# Patient Record
Sex: Female | Born: 1935 | Race: White | Hispanic: No | Smoking: Never smoker
Health system: Southern US, Community
[De-identification: ages and names within clinical notes are randomized; demographics above are authoritative.]

## PROBLEM LIST (undated history)

## (undated) DIAGNOSIS — J986 Disorders of diaphragm: Secondary | ICD-10-CM

## (undated) DIAGNOSIS — K626 Ulcer of anus and rectum: Secondary | ICD-10-CM

## (undated) DIAGNOSIS — K120 Recurrent oral aphthae: Secondary | ICD-10-CM

## (undated) DIAGNOSIS — E875 Hyperkalemia: Secondary | ICD-10-CM

## (undated) DIAGNOSIS — S0502XA Injury of conjunctiva and corneal abrasion without foreign body, left eye, initial encounter: Secondary | ICD-10-CM

## (undated) DIAGNOSIS — G629 Polyneuropathy, unspecified: Secondary | ICD-10-CM

## (undated) DIAGNOSIS — K219 Gastro-esophageal reflux disease without esophagitis: Secondary | ICD-10-CM

## (undated) DIAGNOSIS — R32 Unspecified urinary incontinence: Secondary | ICD-10-CM

## (undated) DIAGNOSIS — S72309A Unspecified fracture of shaft of unspecified femur, initial encounter for closed fracture: Secondary | ICD-10-CM

## (undated) DIAGNOSIS — I6529 Occlusion and stenosis of unspecified carotid artery: Secondary | ICD-10-CM

## (undated) DIAGNOSIS — F419 Anxiety disorder, unspecified: Secondary | ICD-10-CM

## (undated) DIAGNOSIS — M1990 Unspecified osteoarthritis, unspecified site: Secondary | ICD-10-CM

## (undated) DIAGNOSIS — J449 Chronic obstructive pulmonary disease, unspecified: Secondary | ICD-10-CM

## (undated) HISTORY — PX: FOOT SURGERY: SHX648

## (undated) HISTORY — DX: Polyneuropathy, unspecified: G62.9

## (undated) HISTORY — DX: Disorders of diaphragm: J98.6

## (undated) HISTORY — PX: CATARACT EXTRACTION: SUR2

## (undated) HISTORY — DX: Gastro-esophageal reflux disease without esophagitis: K21.9

## (undated) HISTORY — PX: HIP SURGERY: SHX245

## (undated) HISTORY — DX: Occlusion and stenosis of unspecified carotid artery: I65.29

## (undated) HISTORY — DX: Unspecified fracture of shaft of unspecified femur, initial encounter for closed fracture: S72.309A

## (undated) HISTORY — DX: Ulcer of anus and rectum: K62.6

## (undated) HISTORY — DX: Anxiety disorder, unspecified: F41.9

## (undated) HISTORY — PX: OTHER SURGICAL HISTORY: SHX169

## (undated) HISTORY — DX: Unspecified osteoarthritis, unspecified site: M19.90

## (undated) HISTORY — DX: Hyperkalemia: E87.5

## (undated) HISTORY — DX: Injury of conjunctiva and corneal abrasion without foreign body, left eye, initial encounter: S05.02XA

## (undated) HISTORY — DX: Unspecified urinary incontinence: R32

## (undated) HISTORY — DX: Recurrent oral aphthae: K12.0

---

## 2010-08-05 ENCOUNTER — Inpatient Hospital Stay (HOSPITAL_COMMUNITY)
Admission: EM | Admit: 2010-08-05 | Discharge: 2010-08-11 | Payer: Self-pay | Source: Home / Self Care | Attending: Orthopaedic Surgery | Admitting: Orthopaedic Surgery

## 2010-08-05 LAB — COMPREHENSIVE METABOLIC PANEL
ALT: 29 U/L (ref 0–35)
AST: 31 U/L (ref 0–37)
Albumin: 3.7 g/dL (ref 3.5–5.2)
Alkaline Phosphatase: 57 U/L (ref 39–117)
BUN: 16 mg/dL (ref 6–23)
CO2: 30 mEq/L (ref 19–32)
Calcium: 9 mg/dL (ref 8.4–10.5)
Chloride: 95 mEq/L — ABNORMAL LOW (ref 96–112)
Creatinine, Ser: 0.85 mg/dL (ref 0.4–1.2)
GFR calc Af Amer: >60 mL/min (ref >60)
GFR calc non Af Amer: >60 mL/min (ref >60)
Glucose, Bld: 97 mg/dL (ref 70–99)
Potassium: 5.1 mEq/L (ref 3.5–5.1)
Sodium: 133 mEq/L — ABNORMAL LOW (ref 135–145)
Total Bilirubin: 0.4 mg/dL (ref 0.3–1.2)
Total Protein: 6.1 g/dL (ref 6.0–8.3)

## 2010-08-05 LAB — PROTIME-INR
INR: 0.96 (ref 0.00–1.49)
Prothrombin Time: 13 seconds (ref 11.6–15.2)

## 2010-08-05 LAB — CBC
HCT: 36.1 % (ref 36.0–46.0)
HCT: 29.9 % — ABNORMAL LOW (ref 36.0–46.0)
Hemoglobin: 12.3 g/dL (ref 12.0–15.0)
Hemoglobin: 10.2 g/dL — ABNORMAL LOW (ref 12.0–15.0)
MCH: 29.9 pg (ref 26.0–34.0)
MCH: 29.8 pg (ref 26.0–34.0)
MCHC: 34.1 g/dL (ref 30.0–36.0)
MCHC: 34.1 g/dL (ref 30.0–36.0)
MCV: 87.6 fL (ref 78.0–100.0)
MCV: 87.4 fL (ref 78.0–100.0)
Platelets: 258 10*3/uL (ref 150–400)
Platelets: 231 10*3/uL (ref 150–400)
RBC: 4.12 MIL/uL (ref 3.87–5.11)
RBC: 3.42 MIL/uL — ABNORMAL LOW (ref 3.87–5.11)
RDW: 13.4 % (ref 11.5–15.5)
RDW: 13.4 % (ref 11.5–15.5)
WBC: 6.3 10*3/uL (ref 4.0–10.5)
WBC: 9.5 10*3/uL (ref 4.0–10.5)

## 2010-08-05 LAB — DIFFERENTIAL
Basophils Absolute: 0 10*3/uL (ref 0.0–0.1)
Basophils Relative: 0 % (ref 0–1)
Eosinophils Absolute: 0.1 10*3/uL (ref 0.0–0.7)
Eosinophils Relative: 1 % (ref 0–5)
Lymphocytes Relative: 11 % — ABNORMAL LOW (ref 12–46)
Lymphs Abs: 0.7 10*3/uL (ref 0.7–4.0)
Monocytes Absolute: 0.4 10*3/uL (ref 0.1–1.0)
Monocytes Relative: 6 % (ref 3–12)
Neutro Abs: 5.1 10*3/uL (ref 1.7–7.7)
Neutrophils Relative %: 82 % — ABNORMAL HIGH (ref 43–77)

## 2010-08-05 LAB — CREATININE, SERUM
Creatinine, Ser: 0.85 mg/dL (ref 0.4–1.2)
GFR calc Af Amer: >60 mL/min (ref >60)
GFR calc non Af Amer: >60 mL/min (ref >60)

## 2010-08-05 LAB — GLUCOSE, CAPILLARY: Glucose-Capillary: 97 mg/dL (ref 70–99)

## 2010-08-05 LAB — ABO/RH: ABO/RH(D): B POS

## 2010-08-06 LAB — BASIC METABOLIC PANEL
BUN: 11 mg/dL (ref 6–23)
CO2: 30 mEq/L (ref 19–32)
Calcium: 7.5 mg/dL — ABNORMAL LOW (ref 8.4–10.5)
Chloride: 101 mEq/L (ref 96–112)
Creatinine, Ser: 0.86 mg/dL (ref 0.4–1.2)
GFR calc Af Amer: >60 mL/min (ref >60)
GFR calc non Af Amer: >60 mL/min (ref >60)
Glucose, Bld: 105 mg/dL — ABNORMAL HIGH (ref 70–99)
Potassium: 4.6 mEq/L (ref 3.5–5.1)
Sodium: 134 mEq/L — ABNORMAL LOW (ref 135–145)

## 2010-08-06 LAB — CBC
HCT: 25.6 % — ABNORMAL LOW (ref 36.0–46.0)
Hemoglobin: 8.5 g/dL — ABNORMAL LOW (ref 12.0–15.0)
MCH: 29.6 pg (ref 26.0–34.0)
MCHC: 33.2 g/dL (ref 30.0–36.0)
MCV: 89.2 fL (ref 78.0–100.0)
Platelets: 204 10*3/uL (ref 150–400)
RBC: 2.87 MIL/uL — ABNORMAL LOW (ref 3.87–5.11)
RDW: 13.7 % (ref 11.5–15.5)
WBC: 6.6 10*3/uL (ref 4.0–10.5)

## 2010-08-06 LAB — PREPARE RBC (CROSSMATCH)

## 2010-08-07 LAB — TYPE AND SCREEN
ABO/RH(D): B POS
Antibody Screen: NEGATIVE
Unit division: 0
Unit division: 0

## 2010-08-07 LAB — CBC
HCT: 29.8 % — ABNORMAL LOW (ref 36.0–46.0)
Hemoglobin: 10.1 g/dL — ABNORMAL LOW (ref 12.0–15.0)
MCH: 29.1 pg (ref 26.0–34.0)
MCHC: 33.9 g/dL (ref 30.0–36.0)
MCV: 85.9 fL (ref 78.0–100.0)
Platelets: 161 10*3/uL (ref 150–400)
RBC: 3.47 MIL/uL — ABNORMAL LOW (ref 3.87–5.11)
RDW: 15.6 % — ABNORMAL HIGH (ref 11.5–15.5)
WBC: 5.9 10*3/uL (ref 4.0–10.5)

## 2010-08-07 LAB — BASIC METABOLIC PANEL
BUN: 8 mg/dL (ref 6–23)
CO2: 26 mEq/L (ref 19–32)
Calcium: 7.5 mg/dL — ABNORMAL LOW (ref 8.4–10.5)
Chloride: 101 mEq/L (ref 96–112)
Creatinine, Ser: 0.76 mg/dL (ref 0.4–1.2)
GFR calc Af Amer: >60 mL/min (ref >60)
GFR calc non Af Amer: >60 mL/min (ref >60)
Glucose, Bld: 106 mg/dL — ABNORMAL HIGH (ref 70–99)
Potassium: 4 mEq/L (ref 3.5–5.1)
Sodium: 132 mEq/L — ABNORMAL LOW (ref 135–145)

## 2010-08-17 LAB — BASIC METABOLIC PANEL
BUN: 6 mg/dL (ref 6–23)
BUN: 8 mg/dL (ref 6–23)
CO2: 28 mEq/L (ref 19–32)
CO2: 28 mEq/L (ref 19–32)
Calcium: 7.6 mg/dL — ABNORMAL LOW (ref 8.4–10.5)
Calcium: 7.7 mg/dL — ABNORMAL LOW (ref 8.4–10.5)
Chloride: 99 mEq/L (ref 96–112)
Chloride: 105 mEq/L (ref 96–112)
Creatinine, Ser: 0.66 mg/dL (ref 0.4–1.2)
Creatinine, Ser: 0.69 mg/dL (ref 0.4–1.2)
GFR calc Af Amer: >60 mL/min (ref >60)
GFR calc Af Amer: >60 mL/min (ref >60)
GFR calc non Af Amer: >60 mL/min (ref >60)
GFR calc non Af Amer: >60 mL/min (ref >60)
Glucose, Bld: 149 mg/dL — ABNORMAL HIGH (ref 70–99)
Glucose, Bld: 104 mg/dL — ABNORMAL HIGH (ref 70–99)
Potassium: 3.9 mEq/L (ref 3.5–5.1)
Potassium: 4.1 mEq/L (ref 3.5–5.1)
Sodium: 131 mEq/L — ABNORMAL LOW (ref 135–145)
Sodium: 137 mEq/L (ref 135–145)

## 2010-08-17 LAB — CBC
HCT: 27.1 % — ABNORMAL LOW (ref 36.0–46.0)
Hemoglobin: 9.4 g/dL — ABNORMAL LOW (ref 12.0–15.0)
MCH: 29.9 pg (ref 26.0–34.0)
MCHC: 34.7 g/dL (ref 30.0–36.0)
MCV: 86.3 fL (ref 78.0–100.0)
Platelets: 160 10*3/uL (ref 150–400)
RBC: 3.14 MIL/uL — ABNORMAL LOW (ref 3.87–5.11)
RDW: 15.1 % (ref 11.5–15.5)
WBC: 5.4 10*3/uL (ref 4.0–10.5)

## 2011-10-06 ENCOUNTER — Other Ambulatory Visit: Payer: Self-pay | Admitting: Cardiothoracic Surgery

## 2011-10-06 ENCOUNTER — Encounter: Payer: Self-pay | Admitting: Cardiothoracic Surgery

## 2011-10-06 ENCOUNTER — Institutional Professional Consult (permissible substitution) (INDEPENDENT_AMBULATORY_CARE_PROVIDER_SITE_OTHER): Payer: Medicare Other | Admitting: Cardiothoracic Surgery

## 2011-10-06 VITALS — BP 139/65 | HR 93 | Resp 16 | Ht <= 58 in | Wt 110.0 lb

## 2011-10-06 DIAGNOSIS — E875 Hyperkalemia: Secondary | ICD-10-CM

## 2011-10-06 DIAGNOSIS — G629 Polyneuropathy, unspecified: Secondary | ICD-10-CM

## 2011-10-06 DIAGNOSIS — I6529 Occlusion and stenosis of unspecified carotid artery: Secondary | ICD-10-CM

## 2011-10-06 DIAGNOSIS — K449 Diaphragmatic hernia without obstruction or gangrene: Secondary | ICD-10-CM

## 2011-10-06 DIAGNOSIS — M810 Age-related osteoporosis without current pathological fracture: Secondary | ICD-10-CM | POA: Insufficient documentation

## 2011-10-06 DIAGNOSIS — Q791 Other congenital malformations of diaphragm: Secondary | ICD-10-CM

## 2011-10-06 DIAGNOSIS — K219 Gastro-esophageal reflux disease without esophagitis: Secondary | ICD-10-CM

## 2011-10-06 DIAGNOSIS — S0502XA Injury of conjunctiva and corneal abrasion without foreign body, left eye, initial encounter: Secondary | ICD-10-CM

## 2011-10-06 DIAGNOSIS — R69 Illness, unspecified: Secondary | ICD-10-CM

## 2011-10-06 DIAGNOSIS — R32 Unspecified urinary incontinence: Secondary | ICD-10-CM

## 2011-10-06 DIAGNOSIS — R0602 Shortness of breath: Secondary | ICD-10-CM

## 2011-10-06 DIAGNOSIS — K120 Recurrent oral aphthae: Secondary | ICD-10-CM

## 2011-10-06 DIAGNOSIS — F419 Anxiety disorder, unspecified: Secondary | ICD-10-CM

## 2011-10-06 DIAGNOSIS — J986 Disorders of diaphragm: Secondary | ICD-10-CM

## 2011-10-06 NOTE — Progress Notes (Signed)
PCP is Fredderick Severance, MD, MD Referring Provider is Fredderick Severance, MD  Chief Complaint  Patient presents with  . Shortness of Breath    cxr shows elevated left hemidiaphragm....Marland Kitcheneval and treat    HPI: The patient is a 78 year old Caucasian female referred by her primary care physician Dr. Edmon Crape in [facility] for evaluation of a eventration or herniation of left hemidiaphragm. The patient's symptoms mainly involved shortness of breath and a choking feeling when she bends over 200 shoes on especially after eating a meal. Review of her old x-ray shows only an x-ray from January 2012 when she had a femur fracture and a that time her preop chest x-ray showed elevation of the left hemidiaphragm to halfway up the thorax with bile gas pattern consistent with colon underneath the diaphragm. The patient denies any choking episodes or significant pain with eating. She denies any history of regurgitation of undigested food. She denies any history of significant thoracic trauma that could have a result in paralysis of the left phrenic nerve. She has had surgery only for a C-sections which were performed through a laparotomy midline incision decades ago. She is never smoked but she does have some wheezing which bends over. No history of cardiac disease or arrhythmia.   Past Medical History  Diagnosis Date  . Elevated hemidiaphragm     Left  . Anxiety   . Abrasion of cornea, left   . Aphthous ulcer   . Carotid artery stenosis   . GERD (gastroesophageal reflux disease)   . Hyperkalemia   . Osteoarthritis   . Osteoporosis   . Peripheral neuropathy   . Incontinence of urine     urge  . Anal ulcer   . Fracture of femur, shaft     closed, Right    Past Surgical History  Procedure Date  . Cataract extraction   . Cesarean section   . Foot surgery   . Hip surgery     right, Dr. Magnus Ivan  . Neuroplasty decompression median nerve at carpal tunnel     Family History  Problem Relation Age of Onset  .  Alzheimer's disease    . Diabetes type II    . Hypertension    . Mental retardation    . Osteoporosis    . Parkinsonism    . Cancer    . Heart disease      Social History History  Substance Use Topics  . Smoking status: Never Smoker   . Smokeless tobacco: Never Used  . Alcohol Use: No    Current Outpatient Prescriptions  Medication Sig Dispense Refill  . aspirin 81 MG tablet Take 81 mg by mouth daily.      . diclofenac sodium (VOLTAREN) 1 % GEL Apply 1 application topically 4 (four) times daily.      . famotidine (PEPCID) 40 MG tablet Take 40 mg by mouth daily.      Marland Kitchen gabapentin (NEURONTIN) 300 MG capsule Take 300 mg by mouth 3 (three) times daily. 1 tab po every      . oxybutynin (DITROPAN) 5 MG tablet Take 5 mg by mouth 1 day or 1 dose.      Bertram Gala Glycol-Propyl Glycol (SYSTANE ULTRA) 0.4-0.3 % SOLN Apply to eye 3 (three) times daily. One drop in both eyes      . albuterol (PROVENTIL HFA;VENTOLIN HFA) 108 (90 BASE) MCG/ACT inhaler Inhale 2 puffs into the lungs every 6 (six) hours as needed.      . clindamycin (  CLINDAGEL) 1 % gel Apply topically 3 (three) times daily.        No Known Allergies  Review of Systems constitutional review is negative her fever or weight loss HEENT review is negative for difficulty swallowing or dental complaints Cardiac review is negative for arrhythmia angina or history of murmur GI review is negative her history of gallstones ulcer disease or blood per M. neurologic views negative for kidney stones musculoskeletal review is positive for right femur fracture treated at [place] by Dr. Magnus Ivan with a steel pin patient uses a rolling walker to get around the home and she lives independently but is dependent significant her daughter. She is not currently Dr. She denies diabetes. She states she had no significant pulmonary problems when she had her general anesthesia for the right femur pinned one year ago.  BP 139/65  Pulse 93  Resp 16  Ht  4\' 10"  (1.473 m)  Wt 110 lb (49.896 kg)  BMI 22.99 kg/m2  SpO2 98% Physical Exam Gen.-Pleasant elderly Caucasian female no acute distress HEENT-normocephalic pupils equal dentition good Thorax-no tenderness deformity breath sounds are diminished at the left base Cardiac-regular rhythm without murmur or gallop Abdomen fairly flaccid abdominal wall midline surgical scars no pulsatile mass or organomegaly normal bowel sounds -Extremities positive pulses no clubbing or edema or tenderness Neurologic she walks slowly with her walker no focal motor deficit she is right-hand dominant     Diagnostic Tests: Reviewed her chest x-ray from a year ago and her CT scan she brought to the office from [facility] is a CT scan of the chest without contrast is difficult to evaluate whether this is an eventration or a paraesophageal hernia or a lateral a congenital hernia.   Impression: Repeat CT scan of the chest and abdomen with contrast, 2-D echo to evaluate cardiac reserve surgery is a possibility. Her spirometry and office indicates FVC 1.1 FEV1 of 0.86 both of which are 55% of predicted.  Plan: Return after above studies to discuss situation possible surgery once the diagnosis of eventration versus diaphragmatic hernias clarified.

## 2011-10-06 NOTE — Patient Instructions (Signed)
Do not eat a meal and then bend over or leg down The head of bed elevated at night

## 2011-10-07 LAB — CREATININE, SERUM: Creat: 0.87 mg/dL (ref 0.50–1.10)

## 2011-10-07 LAB — BUN: BUN: 28 mg/dL — ABNORMAL HIGH (ref 6–23)

## 2011-10-13 ENCOUNTER — Ambulatory Visit (HOSPITAL_COMMUNITY): Payer: Medicare Other

## 2011-10-14 ENCOUNTER — Ambulatory Visit
Admission: RE | Admit: 2011-10-14 | Discharge: 2011-10-14 | Disposition: A | Discharge status: Home / Self Care | Payer: Medicare Other | Source: Ambulatory Visit | Attending: Cardiothoracic Surgery | Admitting: Cardiothoracic Surgery

## 2011-10-14 DIAGNOSIS — J986 Disorders of diaphragm: Secondary | ICD-10-CM

## 2011-10-14 DIAGNOSIS — K449 Diaphragmatic hernia without obstruction or gangrene: Secondary | ICD-10-CM

## 2011-10-14 MED ORDER — IOHEXOL 300 MG/ML  SOLN
100.0000 mL | Freq: Once | INTRAMUSCULAR | Status: AC | PRN
  Administered 2011-10-14: 100 mL via INTRAVENOUS

## 2011-10-15 ENCOUNTER — Ambulatory Visit (HOSPITAL_COMMUNITY)
Admission: RE | Admit: 2011-10-15 | Discharge: 2011-10-15 | Disposition: A | Discharge status: Home / Self Care | Payer: Medicare Other | Source: Ambulatory Visit | Attending: Cardiothoracic Surgery | Admitting: Cardiothoracic Surgery

## 2011-10-15 DIAGNOSIS — R0602 Shortness of breath: Secondary | ICD-10-CM | POA: Insufficient documentation

## 2011-10-15 DIAGNOSIS — R69 Illness, unspecified: Secondary | ICD-10-CM

## 2011-10-15 DIAGNOSIS — I079 Rheumatic tricuspid valve disease, unspecified: Secondary | ICD-10-CM | POA: Insufficient documentation

## 2011-10-15 DIAGNOSIS — I059 Rheumatic mitral valve disease, unspecified: Secondary | ICD-10-CM | POA: Insufficient documentation

## 2011-10-15 NOTE — Progress Notes (Signed)
*  PRELIMINARY RESULTS* Echocardiogram 2D Echocardiogram has been performed.  Beverly Adkins 10/15/2011, 2:56 PM

## 2011-10-18 ENCOUNTER — Encounter (HOSPITAL_COMMUNITY): Payer: Self-pay | Admitting: *Deleted

## 2011-10-18 ENCOUNTER — Telehealth: Payer: Self-pay | Admitting: *Deleted

## 2011-10-18 ENCOUNTER — Other Ambulatory Visit: Payer: Self-pay

## 2011-10-18 ENCOUNTER — Emergency Department (HOSPITAL_COMMUNITY): Payer: Medicare Other

## 2011-10-18 ENCOUNTER — Inpatient Hospital Stay (HOSPITAL_COMMUNITY)
Admission: EM | Admit: 2011-10-18 | Discharge: 2011-10-27 | DRG: 391 | Disposition: A | Discharge status: Skilled Nursing Facility | Payer: Medicare Other | Attending: Internal Medicine | Admitting: Internal Medicine

## 2011-10-18 DIAGNOSIS — R4182 Altered mental status, unspecified: Secondary | ICD-10-CM

## 2011-10-18 DIAGNOSIS — E875 Hyperkalemia: Secondary | ICD-10-CM

## 2011-10-18 DIAGNOSIS — K120 Recurrent oral aphthae: Secondary | ICD-10-CM

## 2011-10-18 DIAGNOSIS — K5900 Constipation, unspecified: Principal | ICD-10-CM

## 2011-10-18 DIAGNOSIS — R509 Fever, unspecified: Secondary | ICD-10-CM | POA: Diagnosis not present

## 2011-10-18 DIAGNOSIS — S0502XA Injury of conjunctiva and corneal abrasion without foreign body, left eye, initial encounter: Secondary | ICD-10-CM

## 2011-10-18 DIAGNOSIS — M8448XA Pathological fracture, other site, initial encounter for fracture: Secondary | ICD-10-CM | POA: Diagnosis present

## 2011-10-18 DIAGNOSIS — T380X5A Adverse effect of glucocorticoids and synthetic analogues, initial encounter: Secondary | ICD-10-CM | POA: Diagnosis present

## 2011-10-18 DIAGNOSIS — F411 Generalized anxiety disorder: Secondary | ICD-10-CM | POA: Diagnosis present

## 2011-10-18 DIAGNOSIS — N3941 Urge incontinence: Secondary | ICD-10-CM | POA: Diagnosis present

## 2011-10-18 DIAGNOSIS — Z66 Do not resuscitate: Secondary | ICD-10-CM | POA: Diagnosis present

## 2011-10-18 DIAGNOSIS — I6529 Occlusion and stenosis of unspecified carotid artery: Secondary | ICD-10-CM | POA: Diagnosis present

## 2011-10-18 DIAGNOSIS — Z9849 Cataract extraction status, unspecified eye: Secondary | ICD-10-CM

## 2011-10-18 DIAGNOSIS — B952 Enterococcus as the cause of diseases classified elsewhere: Secondary | ICD-10-CM | POA: Diagnosis not present

## 2011-10-18 DIAGNOSIS — J189 Pneumonia, unspecified organism: Secondary | ICD-10-CM | POA: Diagnosis not present

## 2011-10-18 DIAGNOSIS — M1990 Unspecified osteoarthritis, unspecified site: Secondary | ICD-10-CM | POA: Diagnosis present

## 2011-10-18 DIAGNOSIS — G629 Polyneuropathy, unspecified: Secondary | ICD-10-CM | POA: Diagnosis present

## 2011-10-18 DIAGNOSIS — Z7982 Long term (current) use of aspirin: Secondary | ICD-10-CM

## 2011-10-18 DIAGNOSIS — F0390 Unspecified dementia without behavioral disturbance: Secondary | ICD-10-CM | POA: Diagnosis present

## 2011-10-18 DIAGNOSIS — Q791 Other congenital malformations of diaphragm: Secondary | ICD-10-CM

## 2011-10-18 DIAGNOSIS — K219 Gastro-esophageal reflux disease without esophagitis: Secondary | ICD-10-CM | POA: Diagnosis present

## 2011-10-18 DIAGNOSIS — J986 Disorders of diaphragm: Secondary | ICD-10-CM | POA: Diagnosis present

## 2011-10-18 DIAGNOSIS — T40605A Adverse effect of unspecified narcotics, initial encounter: Secondary | ICD-10-CM | POA: Diagnosis present

## 2011-10-18 DIAGNOSIS — M810 Age-related osteoporosis without current pathological fracture: Secondary | ICD-10-CM | POA: Diagnosis present

## 2011-10-18 DIAGNOSIS — IMO0002 Reserved for concepts with insufficient information to code with codable children: Secondary | ICD-10-CM

## 2011-10-18 DIAGNOSIS — F29 Unspecified psychosis not due to a substance or known physiological condition: Secondary | ICD-10-CM | POA: Diagnosis present

## 2011-10-18 DIAGNOSIS — G609 Hereditary and idiopathic neuropathy, unspecified: Secondary | ICD-10-CM | POA: Diagnosis present

## 2011-10-18 DIAGNOSIS — N390 Urinary tract infection, site not specified: Secondary | ICD-10-CM | POA: Diagnosis not present

## 2011-10-18 DIAGNOSIS — R32 Unspecified urinary incontinence: Secondary | ICD-10-CM

## 2011-10-18 DIAGNOSIS — Z79899 Other long term (current) drug therapy: Secondary | ICD-10-CM

## 2011-10-18 DIAGNOSIS — F419 Anxiety disorder, unspecified: Secondary | ICD-10-CM

## 2011-10-18 DIAGNOSIS — S32009A Unspecified fracture of unspecified lumbar vertebra, initial encounter for closed fracture: Secondary | ICD-10-CM

## 2011-10-18 LAB — URINALYSIS, ROUTINE W REFLEX MICROSCOPIC
Glucose, UA: NEGATIVE mg/dL
Hgb urine dipstick: NEGATIVE
Leukocytes, UA: NEGATIVE
Protein, ur: NEGATIVE mg/dL
Specific Gravity, Urine: 1.012 (ref 1.005–1.030)
pH: 7 (ref 5.0–8.0)

## 2011-10-18 LAB — COMPREHENSIVE METABOLIC PANEL
ALT: 35 U/L (ref 0–35)
AST: 33 U/L (ref 0–37)
Albumin: 3.5 g/dL (ref 3.5–5.2)
CO2: 26 mEq/L (ref 19–32)
Calcium: 9.5 mg/dL (ref 8.4–10.5)
Creatinine, Ser: 0.62 mg/dL (ref 0.50–1.10)
Sodium: 136 mEq/L (ref 135–145)
Total Protein: 6.7 g/dL (ref 6.0–8.3)

## 2011-10-18 LAB — DIFFERENTIAL
Basophils Absolute: 0 10*3/uL (ref 0.0–0.1)
Basophils Relative: 0 % (ref 0–1)
Eosinophils Relative: 1 % (ref 0–5)
Lymphocytes Relative: 14 % (ref 12–46)
Monocytes Absolute: 0.6 10*3/uL (ref 0.1–1.0)
Neutro Abs: 6.4 10*3/uL (ref 1.7–7.7)

## 2011-10-18 LAB — CBC
HCT: 39.4 % (ref 36.0–46.0)
MCHC: 34.3 g/dL (ref 30.0–36.0)
MCV: 86 fL (ref 78.0–100.0)
Platelets: 262 10*3/uL (ref 150–400)
RDW: 13.9 % (ref 11.5–15.5)
WBC: 8.3 10*3/uL (ref 4.0–10.5)

## 2011-10-18 LAB — TROPONIN I: Troponin I: <0.3 ng/mL (ref <0.30)

## 2011-10-18 MED ORDER — DEXTROSE-NACL 5-0.9 % IV SOLN
INTRAVENOUS | Status: DC
  Administered 2011-10-19: 100 mL/h via INTRAVENOUS

## 2011-10-18 MED ORDER — BISACODYL 10 MG RE SUPP: 10.0000 mg | Freq: Every day | RECTAL | Status: DC | PRN

## 2011-10-18 MED ORDER — POLYETHYL GLYCOL-PROPYL GLYCOL 0.4-0.3 % OP SOLN: 1.0000 [drp] | Freq: Three times a day (TID) | OPHTHALMIC | Status: DC

## 2011-10-18 MED ORDER — FENTANYL CITRATE 0.05 MG/ML IJ SOLN
50.0000 ug | Freq: Once | INTRAMUSCULAR | Status: AC
  Administered 2011-10-18: 50 ug via INTRAVENOUS
  Filled 2011-10-18: qty 2

## 2011-10-18 MED ORDER — SODIUM CHLORIDE 0.9 % IV SOLN
INTRAVENOUS | Status: DC
  Administered 2011-10-18: 16:00:00 via INTRAVENOUS
  Administered 2011-10-19: 200 mL via INTRAVENOUS
  Administered 2011-10-20: 20 mL/h via INTRAVENOUS

## 2011-10-18 MED ORDER — ACETAMINOPHEN 650 MG RE SUPP: 650.0000 mg | Freq: Four times a day (QID) | RECTAL | Status: DC | PRN

## 2011-10-18 MED ORDER — FAMOTIDINE 40 MG PO TABS
60.0000 mg | ORAL_TABLET | Freq: Every day | ORAL | Status: DC
  Administered 2011-10-19 – 2011-10-27 (×9): 60 mg via ORAL
  Filled 2011-10-18 (×9): qty 1

## 2011-10-18 MED ORDER — MORPHINE SULFATE 2 MG/ML IJ SOLN
2.0000 mg | Freq: Once | INTRAMUSCULAR | Status: AC
  Administered 2011-10-18: 2 mg via INTRAVENOUS
  Filled 2011-10-18: qty 1

## 2011-10-18 MED ORDER — ONDANSETRON HCL 4 MG/2ML IJ SOLN
4.0000 mg | Freq: Once | INTRAMUSCULAR | Status: AC
  Administered 2011-10-18: 4 mg via INTRAVENOUS
  Filled 2011-10-18: qty 2

## 2011-10-18 MED ORDER — ACETAMINOPHEN 325 MG PO TABS
650.0000 mg | ORAL_TABLET | Freq: Four times a day (QID) | ORAL | Status: DC | PRN
  Administered 2011-10-19 – 2011-10-24 (×2): 650 mg via ORAL
  Filled 2011-10-18 (×2): qty 2

## 2011-10-18 MED ORDER — ASPIRIN 81 MG PO CHEW
81.0000 mg | CHEWABLE_TABLET | Freq: Every day | ORAL | Status: DC
  Administered 2011-10-19 – 2011-10-27 (×9): 81 mg via ORAL
  Filled 2011-10-18 (×9): qty 1

## 2011-10-18 MED ORDER — MAGNESIUM CITRATE PO SOLN: 1.0000 | Freq: Once | ORAL | Status: AC | PRN

## 2011-10-18 MED ORDER — POLYVINYL ALCOHOL 1.4 % OP SOLN
1.0000 [drp] | Freq: Three times a day (TID) | OPHTHALMIC | Status: DC
  Administered 2011-10-19 – 2011-10-27 (×22): 1 [drp] via OPHTHALMIC
  Filled 2011-10-18: qty 15

## 2011-10-18 MED ORDER — KETOROLAC TROMETHAMINE 30 MG/ML IJ SOLN
30.0000 mg | Freq: Four times a day (QID) | INTRAMUSCULAR | Status: DC | PRN
  Filled 2011-10-18: qty 1

## 2011-10-18 MED ORDER — ENOXAPARIN SODIUM 40 MG/0.4ML ~~LOC~~ SOLN
40.0000 mg | SUBCUTANEOUS | Status: DC
  Administered 2011-10-19 – 2011-10-27 (×9): 40 mg via SUBCUTANEOUS
  Filled 2011-10-18 (×9): qty 0.4

## 2011-10-18 NOTE — ED Notes (Signed)
Patient transported to CT 

## 2011-10-18 NOTE — Telephone Encounter (Signed)
Beverly Adkins called to say that Beverly Adkins was having terrible pain in her abdomen.  She is a patient of Dr. Donata Clay.  I told her I felt the best advice I had for her was to call an ambulance to be taken to the ER...she agreed.

## 2011-10-18 NOTE — ED Notes (Signed)
Family wants Dr. Morton Peters.

## 2011-10-18 NOTE — ED Provider Notes (Addendum)
History     CSN: [id number]  Arrival date & time 10/18/11  1326   First MD Initiated Contact with Patient 10/18/11 1504      No chief complaint on file.  chief complaint:  Abdominal pain and confusion  (Consider location/radiation/quality/duration/timing/severity/associated sxs/prior treatment) HPI:  Level V caveat for confusion.  History obtained from patient and daughter. Patient complains of chest pain, abdominal pain, back pain since 10 AM. she normally lives at home independently.  Daughter reports that she was on the commode and "folded herself over" with pain. She has recently been placed on tramadol for knee pain by Dr. Fredderick Severance in [facility].  She has also been seen by Dr. Helene Kelp  locally for problems with her left diaphragm.  No fever sweats or chills, dysuria, chest pain, shortness of breath. Patient is confused.  Past Medical History  Diagnosis Date  . Elevated hemidiaphragm     Left  . Anxiety   . Abrasion of cornea, left   . Aphthous ulcer   . Carotid artery stenosis   . GERD (gastroesophageal reflux disease)   . Hyperkalemia   . Osteoarthritis   . Osteoporosis   . Peripheral neuropathy   . Incontinence of urine     urge  . Anal ulcer   . Fracture of femur, shaft     closed, Right    Past Surgical History  Procedure Date  . Cataract extraction   . Cesarean section   . Foot surgery   . Hip surgery     right, Dr. Magnus Ivan  . Neuroplasty decompression median nerve at carpal tunnel     Family History  Problem Relation Age of Onset  . Alzheimer's disease    . Diabetes type II    . Hypertension    . Mental retardation    . Osteoporosis    . Parkinsonism    . Cancer    . Heart disease      History  Substance Use Topics  . Smoking status: Never Smoker   . Smokeless tobacco: Never Used  . Alcohol Use: No    OB History    Grav Para Term Preterm Abortions TAB SAB Ect Mult Living                  Review of Systems  Unable to perform ROS:  Mental status change    Allergies  Review of patient's allergies indicates no known allergies.  Home Medications   Current Outpatient Rx  Name Route Sig Dispense Refill  . ALBUTEROL SULFATE HFA 108 (90 BASE) MCG/ACT IN AERS Inhalation Inhale 2 puffs into the lungs every 6 (six) hours as needed. Shortness of breath    . ASPIRIN 81 MG PO TABS Oral Take 81 mg by mouth daily.    Marland Kitchen CLINDAMYCIN PHOSPHATE 1 % EX GEL Topical Apply topically 3 (three) times daily.    Marland Kitchen DICLOFENAC SODIUM 1 % TD GEL Topical Apply 1 application topically 4 (four) times daily.    Marland Kitchen FAMOTIDINE 40 MG PO TABS Oral Take 60 mg by mouth daily.     Marland Kitchen GABAPENTIN 300 MG PO CAPS Oral Take 300-900 mg by mouth 3 (three) times daily. Takes 1 capsule in the morning, 2 capsules at noon, and 3 capsules in the evening.    . OXYBUTYNIN CHLORIDE 5 MG PO TABS Oral Take 5 mg by mouth daily.     Marland Kitchen POLYETHYL GLYCOL-PROPYL GLYCOL 0.4-0.3 % OP SOLN Ophthalmic Apply to eye 3 (  three) times daily. One drop in both eyes    . TRAMADOL HCL 50 MG PO TABS Oral Take 50 mg by mouth 2 (two) times daily as needed. pain      BP 137/67  Pulse 111  Temp(Src) 97.7 F (36.5 C) (Oral)  Resp 16  SpO2 92%  Physical Exam  Nursing note and vitals reviewed. Constitutional: She appears well-developed and well-nourished.  HENT:  Head: Normocephalic and atraumatic.  Eyes: Conjunctivae and EOM are normal. Pupils are equal, round, and reactive to light.  Neck: Normal range of motion. Neck supple.  Cardiovascular: Normal rate and regular rhythm.   Pulmonary/Chest: Effort normal and breath sounds normal.  Abdominal: Soft. Bowel sounds are normal.  Musculoskeletal: Normal range of motion.  Neurological:       Alert and oriented x0. Moving all extremities normally.  Skin: Skin is warm and dry.  Psychiatric: She has a normal mood and affect.    ED Course  Procedures (including critical care time)  Labs Reviewed  COMPREHENSIVE METABOLIC PANEL - Abnormal;  Notable for the following:    GFR calc non Af Amer 81 (*)    All other components within normal limits  URINALYSIS, ROUTINE W REFLEX MICROSCOPIC - Abnormal; Notable for the following:    Ketones, ur 40 (*)    All other components within normal limits  CBC  DIFFERENTIAL  LIPASE, BLOOD  TROPONIN I   Ct Head Wo Contrast  10/18/2011  *RADIOLOGY REPORT*  Clinical Data: Altered mental status.  Confusion.  CT HEAD WITHOUT CONTRAST  Technique:  Contiguous axial images were obtained from the base of the skull through the vertex without contrast.  Comparison: None.  Findings: No intracranial hemorrhage.  No CT evidence of large acute infarct.  No intracranial mass lesion detected on this unenhanced exam.  Small vessel disease type changes.  Global atrophy without hydrocephalus.  Vascular calcifications.  Question minimal partial opacification inferior right mastoid air cells.  IMPRESSION: No acute abnormality.  Original Report Authenticated By: Fuller Canada, M.D.   Dg Abd Acute W/chest  10/18/2011  *RADIOLOGY REPORT*  Clinical Data: Abdominal pain.  Confusion  ACUTE ABDOMEN SERIES (ABDOMEN 2 VIEW & CHEST 1 VIEW)  Comparison: 10/14/2011  Findings: There is marked asymmetric elevation of the left hemidiaphragm.  This is similar to previous exam.  The heart size is normal.  No pleural effusion or edema.  Pulmonary venous congestion is identified which appears similar to previous exam.  No airspace consolidation identified.  There is a marked amount of desiccated stool identified within the colon and rectum. Rectal impaction is suspected.  No dilated loops of small bowel or fluid levels identified.  IMPRESSION:  1.  No acute cardiopulmonary abnormalities. 2.  Suspect rectal impaction with constipation.  Original Report Authenticated By: Rosealee Albee, M.D.     1. Altered mental status   2. Constipation     Date: 10/18/2011  Rate: 95  Rhythm: normal sinus rhythm  QRS Axis: normal  Intervals:  normal  ST/T Wave abnormalities: normal  Conduction Disutrbances:none  Narrative Interpretation:   Old EKG Reviewed: none available    MDM  Patient has a constellation of symptoms. Daughter said she is normally quite lucid. This is not the case today. Head CT is normal. No no obvious urinary tract infection.  Three-way abdomen shows obvious distal fecal bolus. This probably accounts for her abdominal pain.  Will admit.        Donnetta Hutching, MD 10/18/11 [phone]  Donnetta Hutching, MD 10/18/11 [phone]

## 2011-10-18 NOTE — ED Notes (Signed)
RN and EMT attempted to place pt on bedpan.  Pt confused and denied bedpan.  Chucks placed beneath patient.

## 2011-10-18 NOTE — ED Notes (Signed)
Called to give report to Lupita Leash, Charity fundraiser.  Will call back in 15 minutes.

## 2011-10-18 NOTE — ED Notes (Addendum)
Patient's daughter states she found the patient in extreme pain in her chest and back around 1230 today. Patient states she has chest and back pain at this time but can not answer how much pain. Patient has bilateral leg and feet edema. Patient's daughter states the patient walks daily with a walker. Patient lives alone and cares for herself on a daily basis.  Patient placed on monitor and is resting with family at bedside.

## 2011-10-18 NOTE — H&P (Signed)
PCP:   Fredderick Severance, MD, MD   Chief Complaint: Altered mental status and back pain   HPI: Beverly Adkins is an 78 y.o. female with history of anxiety, carotid stenosis, anxiety, elevated left hemidiaphragm, developed lower back pain for the past couple of days, started on Ultram, presents to [facility] emergency room because of altered mental status. Her daughter states that she has not been herself. Her daughter visits her daily. She has been confused and said that she was held in hostage. She also has severe constipation and abdominal pain, however this is not new. She does have chronic constipation. She denied chest pain, shortness of breath, nausea, vomiting, black stool, bloody stool, fever, chills, stiff neck, headache, or any visual problems. Workup in the emergency room included a negative head CT, normal white count, unremarkable urinalysis, unremarkable electrolytes, and a clear chest x-ray. She recently had abdominal pelvic CT, and it did show an L1 compression fracture of unclear duration. She also has elevated left hemidiaphragm, without any respiratory problems. Hospital was was asked to admit her for altered mental status and constipation.  Rewiew of Systems:  The patient denies anorexia, fever, weight loss,, vision loss, decreased hearing, hoarseness, chest pain, syncope, dyspnea on exertion, peripheral edema, balance deficits, hemoptysis, abdominal pain, melena, hematochezia, severe indigestion/heartburn, hematuria, incontinence, genital sores, muscle weakness, suspicious skin lesions, transient blindness, difficulty walking, depression, unusual weight change, abnormal bleeding, enlarged lymph nodes, angioedema, and breast masses.   Past Medical History  Diagnosis Date  . Elevated hemidiaphragm     Left  . Anxiety   . Abrasion of cornea, left   . Aphthous ulcer   . Carotid artery stenosis   . GERD (gastroesophageal reflux disease)   . Hyperkalemia   . Osteoarthritis   .  Osteoporosis   . Peripheral neuropathy   . Incontinence of urine     urge  . Anal ulcer   . Fracture of femur, shaft     closed, Right    Past Surgical History  Procedure Date  . Cataract extraction   . Cesarean section   . Foot surgery   . Hip surgery     right, Dr. Magnus Ivan  . Neuroplasty decompression median nerve at carpal tunnel     Medications:  HOME MEDS: Prior to Admission medications   Medication Sig Start Date End Date Taking? Authorizing Provider  albuterol (PROVENTIL HFA;VENTOLIN HFA) 108 (90 BASE) MCG/ACT inhaler Inhale 2 puffs into the lungs every 6 (six) hours as needed. Shortness of breath   Yes Historical Provider, MD  aspirin 81 MG tablet Take 81 mg by mouth daily.   Yes Historical Provider, MD  clindamycin (CLINDAGEL) 1 % gel Apply topically 3 (three) times daily.   Yes Historical Provider, MD  diclofenac sodium (VOLTAREN) 1 % GEL Apply 1 application topically 4 (four) times daily.   Yes Historical Provider, MD  famotidine (PEPCID) 40 MG tablet Take 60 mg by mouth daily.    Yes Historical Provider, MD  gabapentin (NEURONTIN) 300 MG capsule Take 300-900 mg by mouth 3 (three) times daily. Takes 1 capsule in the morning, 2 capsules at noon, and 3 capsules in the evening.   Yes Historical Provider, MD  oxybutynin (DITROPAN) 5 MG tablet Take 5 mg by mouth daily.    Yes Historical Provider, MD  Polyethyl Glycol-Propyl Glycol (SYSTANE ULTRA) 0.4-0.3 % SOLN Apply to eye 3 (three) times daily. One drop in both eyes   Yes Historical Provider, MD  traMADol Janean Sark) 50  MG tablet Take 50 mg by mouth 2 (two) times daily as needed. pain   Yes Historical Provider, MD     Allergies:  No Known Allergies  Social History:   reports that she has never smoked. She has never used smokeless tobacco. She reports that she does not drink alcohol or use illicit drugs.  Family History: Family History  Problem Relation Age of Onset  . Alzheimer's disease    . Diabetes type II    .  Hypertension    . Mental retardation    . Osteoporosis    . Parkinsonism    . Cancer    . Heart disease       Physical Exam: Filed Vitals:   10/18/11 1345 10/18/11 1447 10/18/11 1807 10/18/11 2021  BP: 155/76 144/71 137/67 163/74  Pulse: 94 93 111 116  Temp: 97.2 F (36.2 C) 97.7 F (36.5 C)    TempSrc: Oral Oral    Resp: 14 24 16 17   SpO2: 96% 95% 92% 95%   Blood pressure 163/74, pulse 116, temperature 97.7 F (36.5 C), temperature source Oral, resp. rate 17, SpO2 95.00%.  GEN:  Pleasant  person lying in the stretcher in no acute distress; cooperative with exam PSYCH:  alert and oriented x4; does not appear anxious does not appear depressed; affect is normal HEENT: Mucous membranes pink and anicteric; PERRLA; EOM intact; no cervical lymphadenopathy nor thyromegaly or carotid bruit; no JVD; Breasts:: Not examined CHEST WALL: No tenderness CHEST: Normal respiration, clear to auscultation bilaterally HEART: Regular rate and rhythm; no murmurs rubs or gallops BACK: No kyphosis or scoliosis; no CVA tenderness ABDOMEN: She guarded her abdomen and refuses examination. With distraction, her abdomen has no acute finding. Rectal Exam: Not done EXTREMITIES: No bone or joint deformity; age-appropriate arthropathy of the hands and knees; no edema; no ulcerations. Genitalia: not examined PULSES: 2+ and symmetric SKIN: Normal hydration no rash or ulceration CNS: Cranial nerves 2-12 grossly intact no focal neurologic deficit. Her speech is fluent, uvula elevated with phonation, strength equal bilaterally. She has facial symmetry.   Labs & Imaging Results for orders placed during the hospital encounter of 10/18/11 (from the past 48 hour(s))  CBC     Status: Normal   Collection Time   10/18/11  3:43 PM      Component Value Range Comment   WBC 8.3  4.0 - 10.5 (K/uL)    RBC 4.58  3.87 - 5.11 (MIL/uL)    Hemoglobin 13.5  12.0 - 15.0 (g/dL)    HCT 45.4  09.8 - 11.9 (%)    MCV 86.0  78.0 -  100.0 (fL)    MCH 29.5  26.0 - 34.0 (pg)    MCHC 34.3  30.0 - 36.0 (g/dL)    RDW 14.7  82.9 - 56.2 (%)    Platelets 262  150 - 400 (K/uL)   DIFFERENTIAL     Status: Normal   Collection Time   10/18/11  3:43 PM      Component Value Range Comment   Neutrophils Relative 77  43 - 77 (%)    Neutro Abs 6.4  1.7 - 7.7 (K/uL)    Lymphocytes Relative 14  12 - 46 (%)    Lymphs Abs 1.2  0.7 - 4.0 (K/uL)    Monocytes Relative 8  3 - 12 (%)    Monocytes Absolute 0.6  0.1 - 1.0 (K/uL)    Eosinophils Relative 1  0 - 5 (%)  Eosinophils Absolute 0.1  0.0 - 0.7 (K/uL)    Basophils Relative 0  0 - 1 (%)    Basophils Absolute 0.0  0.0 - 0.1 (K/uL)   COMPREHENSIVE METABOLIC PANEL     Status: Abnormal   Collection Time   10/18/11  3:43 PM      Component Value Range Comment   Sodium 136  135 - 145 (mEq/L)    Potassium 4.0  3.5 - 5.1 (mEq/L)    Chloride 97  96 - 112 (mEq/L)    CO2 26  19 - 32 (mEq/L)    Glucose, Bld 98  70 - 99 (mg/dL)    BUN 19  6 - 23 (mg/dL)    Creatinine, Ser 4.09  0.50 - 1.10 (mg/dL)    Calcium 9.5  8.4 - 10.5 (mg/dL)    Total Protein 6.7  6.0 - 8.3 (g/dL)    Albumin 3.5  3.5 - 5.2 (g/dL)    AST 33  0 - 37 (U/L)    ALT 35  0 - 35 (U/L)    Alkaline Phosphatase 98  39 - 117 (U/L)    Total Bilirubin 0.3  0.3 - 1.2 (mg/dL)    GFR calc non Af Amer 81 (*) >90 (mL/min)    GFR calc Af Amer >90  >90 (mL/min)   LIPASE, BLOOD     Status: Normal   Collection Time   10/18/11  3:43 PM      Component Value Range Comment   Lipase 24  11 - 59 (U/L)   TROPONIN I     Status: Normal   Collection Time   10/18/11  3:44 PM      Component Value Range Comment   Troponin I <0.30  <0.30 (ng/mL)   URINALYSIS, ROUTINE W REFLEX MICROSCOPIC     Status: Abnormal   Collection Time   10/18/11  6:24 PM      Component Value Range Comment   Color, Urine YELLOW  YELLOW     APPearance CLEAR  CLEAR     Specific Gravity, Urine 1.012  1.005 - 1.030     pH 7.0  5.0 - 8.0     Glucose, UA NEGATIVE  NEGATIVE  (mg/dL)    Hgb urine dipstick NEGATIVE  NEGATIVE     Bilirubin Urine NEGATIVE  NEGATIVE     Ketones, ur 40 (*) NEGATIVE (mg/dL)    Protein, ur NEGATIVE  NEGATIVE (mg/dL)    Urobilinogen, UA 0.2  0.0 - 1.0 (mg/dL)    Nitrite NEGATIVE  NEGATIVE     Leukocytes, UA NEGATIVE  NEGATIVE  MICROSCOPIC NOT DONE ON URINES WITH NEGATIVE PROTEIN, BLOOD, LEUKOCYTES, NITRITE, OR GLUCOSE <1000 mg/dL.   Ct Head Wo Contrast  10/18/2011  *RADIOLOGY REPORT*  Clinical Data: Altered mental status.  Confusion.  CT HEAD WITHOUT CONTRAST  Technique:  Contiguous axial images were obtained from the base of the skull through the vertex without contrast.  Comparison: None.  Findings: No intracranial hemorrhage.  No CT evidence of large acute infarct.  No intracranial mass lesion detected on this unenhanced exam.  Small vessel disease type changes.  Global atrophy without hydrocephalus.  Vascular calcifications.  Question minimal partial opacification inferior right mastoid air cells.  IMPRESSION: No acute abnormality.  Original Report Authenticated By: Fuller Canada, M.D.   Dg Abd Acute W/chest  10/18/2011  *RADIOLOGY REPORT*  Clinical Data: Abdominal pain.  Confusion  ACUTE ABDOMEN SERIES (ABDOMEN 2 VIEW & CHEST 1 VIEW)  Comparison:  10/14/2011  Findings: There is marked asymmetric elevation of the left hemidiaphragm.  This is similar to previous exam.  The heart size is normal.  No pleural effusion or edema.  Pulmonary venous congestion is identified which appears similar to previous exam.  No airspace consolidation identified.  There is a marked amount of desiccated stool identified within the colon and rectum. Rectal impaction is suspected.  No dilated loops of small bowel or fluid levels identified.  IMPRESSION:  1.  No acute cardiopulmonary abnormalities. 2.  Suspect rectal impaction with constipation.  Original Report Authenticated By: Rosealee Albee, M.D.      Assessment Present on Admission:  .Elevated  hemidiaphragm .Peripheral neuropathy .Carotid artery stenosis Altered mental status likely medication induced Severe constipation  L1 compressive fracture  PLAN: I suspect that she has altered mental status from the Ultram, and the fentanyl given in the emergency room. We'll admit her for observation, readjust her medications. We'll give her some IV fluid. After checking for swallowing, she will be given regular diet. Note that the characteristic and chemical composition of Ultram is very similar to the codeine family and although this has not been made a narcotic by the [facility] classification, its effect is no different. Please consider consult orthopedics for her L1 compression fracture. I will order physical therapy. I also discussed her prior wish with her daughter, and she is a DO NOT RESUSCITATE in case of a cardiopulmonary arrest. We will honor her wishes. She is stable, and will be admitted to triad hospitalist team 9.  Other plans as per orders.    Daiel Strohecker 10/18/2011, 8:26 PM

## 2011-10-19 ENCOUNTER — Encounter (HOSPITAL_COMMUNITY): Payer: Self-pay | Admitting: Radiology

## 2011-10-19 ENCOUNTER — Observation Stay (HOSPITAL_COMMUNITY): Payer: Medicare Other

## 2011-10-19 LAB — CBC
MCHC: 33.7 g/dL (ref 30.0–36.0)
MCV: 86.8 fL (ref 78.0–100.0)
Platelets: 251 10*3/uL (ref 150–400)
RDW: 14.2 % (ref 11.5–15.5)
WBC: 5.4 10*3/uL (ref 4.0–10.5)

## 2011-10-19 LAB — BASIC METABOLIC PANEL
Calcium: 8.5 mg/dL (ref 8.4–10.5)
Creatinine, Ser: 0.59 mg/dL (ref 0.50–1.10)
GFR calc Af Amer: >90 mL/min (ref >90)

## 2011-10-19 LAB — TSH: TSH: 2.244 u[IU]/mL (ref 0.350–4.500)

## 2011-10-19 LAB — T4, FREE: Free T4: 1.27 ng/dL (ref 0.80–1.80)

## 2011-10-19 MED ORDER — IOHEXOL 300 MG/ML  SOLN
80.0000 mL | Freq: Once | INTRAMUSCULAR | Status: AC | PRN
  Administered 2011-10-19: 80 mL via INTRAVENOUS

## 2011-10-19 MED ORDER — MORPHINE SULFATE 2 MG/ML IJ SOLN
1.0000 mg | INTRAMUSCULAR | Status: DC | PRN
  Administered 2011-10-20 (×2): 1 mg via INTRAVENOUS
  Filled 2011-10-19 (×3): qty 1

## 2011-10-19 MED ORDER — MORPHINE SULFATE 2 MG/ML IJ SOLN
INTRAMUSCULAR | Status: AC
  Administered 2011-10-19: 1 mg via INTRAVENOUS
  Filled 2011-10-19: qty 1

## 2011-10-19 MED ORDER — AMLODIPINE BESYLATE 10 MG PO TABS
10.0000 mg | ORAL_TABLET | Freq: Every day | ORAL | Status: DC
  Administered 2011-10-19 – 2011-10-25 (×7): 10 mg via ORAL
  Filled 2011-10-19 (×9): qty 1

## 2011-10-19 MED ORDER — IOHEXOL 300 MG/ML  SOLN
20.0000 mL | INTRAMUSCULAR | Status: AC
  Administered 2011-10-19: 20 mL via ORAL

## 2011-10-19 MED ORDER — POLYETHYLENE GLYCOL 3350 17 G PO PACK
17.0000 g | PACK | Freq: Every day | ORAL | Status: DC
  Administered 2011-10-19 – 2011-10-20 (×2): 17 g via ORAL
  Filled 2011-10-19 (×2): qty 1

## 2011-10-19 NOTE — Progress Notes (Signed)
Utilzation review complete.  

## 2011-10-19 NOTE — Evaluation (Signed)
Physical Therapy Evaluation Patient Details Name: Beverly Adkins MRN: [id number] DOB: 03/05/1925 Today's Date: 10/19/2011  Problem List:  Patient Active Problem List  Diagnoses  . Elevated hemidiaphragm  . Anxiety  . Abrasion of cornea, left  . Aphthous ulcer  . Carotid artery stenosis  . GERD (gastroesophageal reflux disease)  . Hyperkalemia  . Osteoporosis  . Peripheral neuropathy  . Incontinence of urine  . Congenital anomaly of diaphragm  . Altered mental status  . Constipation  . Fracture lumbar vertebra-closed    Past Medical History:  Past Medical History  Diagnosis Date  . Elevated hemidiaphragm     Left  . Anxiety   . Abrasion of cornea, left   . Aphthous ulcer   . Carotid artery stenosis   . GERD (gastroesophageal reflux disease)   . Hyperkalemia   . Osteoarthritis   . Osteoporosis   . Peripheral neuropathy   . Incontinence of urine     urge  . Anal ulcer   . Fracture of femur, shaft     closed, Right   Past Surgical History:  Past Surgical History  Procedure Date  . Cataract extraction   . Cesarean section   . Foot surgery   . Hip surgery     right, Dr. Magnus Ivan  . Neuroplasty decompression median nerve at carpal tunnel     PT Assessment/Plan/Recommendation PT Assessment Clinical Impression Statement: Pt admitted for AMS, likely medicated related, administered for pain from L1 compression fx. Pt presents with impaired cognition, acute pain, and decreased strength, and will benefit from skilled PT to address below impairments to decrease falls risk and caregiver burden at next venue of care. Attempted to teach pt safe mobility techniques, but unable to retain due to cognitive impairment. Pt may need SNF stay pending cognitive clearing and mobility status at DC. I left a message with her daughter, still needing more information on level of assist available at home.  PT Recommendation/Assessment: Patient will need skilled PT in the acute care venue PT  Problem List: Decreased strength;Decreased range of motion;Decreased activity tolerance;Decreased balance;Decreased mobility;Decreased cognition;Decreased knowledge of use of DME;Decreased safety awareness;Decreased knowledge of precautions;Pain Problem List Comments: May be limited due to cognition Barriers to Discharge: None PT Therapy Diagnosis : Difficulty walking;Generalized weakness;Acute pain PT Plan PT Frequency: Min 3X/week PT Treatment/Interventions: DME instruction;Gait training;Functional mobility training;Therapeutic activities;Therapeutic exercise;Balance training;Neuromuscular re-education;Cognitive remediation;Patient/family education PT Recommendation Recommendations for Other Services: OT consult Follow Up Recommendations: Skilled nursing facility;Supervision/Assistance - 24 hour  Equipment Recommended: None recommended by PT PT Goals  Acute Rehab PT Goals PT Goal Formulation: Patient unable to participate in goal setting Time For Goal Achievement: 7 days Pt will Roll Supine to Left Side: with min assist PT Goal: Rolling Supine to Left Side - Progress: Goal set today Pt will go Supine/Side to Sit: with min assist PT Goal: Supine/Side to Sit - Progress: Goal set today Pt will go Sit to Supine/Side: with min assist;with HOB 0 degrees PT Goal: Sit to Supine/Side - Progress: Goal set today Pt will go Sit to Stand: with min assist;with upper extremity assist PT Goal: Sit to Stand - Progress: Goal set today Pt will go Stand to Sit: with min assist;with upper extremity assist PT Goal: Stand to Sit - Progress: Goal set today Pt will Transfer Bed to Chair/Chair to Bed: with min assist PT Transfer Goal: Bed to Chair/Chair to Bed - Progress: Goal set today Pt will Ambulate: 16 - 50 feet;with min assist;with rolling walker (50') PT  Goal: Ambulate - Progress: Goal set today  PT Evaluation Precautions/Restrictions  Precautions Precautions: Fall (L1 compression fx) Required  Braces or Orthoses: No Restrictions Weight Bearing Restrictions: No Prior Functioning  Home Living Lives With: Alone Receives Help From: Family (Daughter lives same street, available days) Type of Home: House Home Layout: One level Home Access: Level entry Bathroom Shower/Tub: Engineer, manufacturing systems: Standard Home Adaptive Equipment: Bedside commode/3-in-1;Shower chair with back;Walker - rolling;Walker - four wheeled;Grab bars in shower;Grab bars around toilet Prior Function Level of Independence: Requires assistive device for independence (Uses RW and 4WW, Mod I with bathing/dressing) Able to Take Stairs?: Yes Driving: No Cognition Cognition Arousal/Alertness: Awake/alert Overall Cognitive Status: Impaired Orientation Level: Oriented to person;Oriented to time;Disoriented to situation;Disoriented to place Cognition - Other Comments: Pt demos paranoid behaviors and significant pain behaviors, denying any pain, except "He's squeezing my hand." Max encouragement needed for mobility.  Sensation/Coordination Sensation Light Touch: Appears Intact Additional Comments: Pt with kinesiotape to bil LEs for edema, applied by daughter Coordination Gross Motor Movements are Fluid and Coordinated: No (With with decreased motor activation d/t pain) Extremity Assessment RUE Assessment RUE Assessment: Exceptions to [facility] (Defer to OT, decreased AROM, likely d/t pain) LUE Assessment LUE Assessment: Exceptions to [facility] (Defer to OT, decreased AROM, likely d/t pain) RLE Assessment RLE Assessment: Exceptions to [facility] RLE Strength RLE Overall Strength Comments: Grossly 3/5 strength observed wtih functional mobility, NT formally d/t compression fx LLE Assessment LLE Assessment: Exceptions to [facility] LLE Strength LLE Overall Strength Comments: Grossly 3/5 strength observed wtih functional mobility, NT formally d/t compression fx Mobility (including Balance) Bed Mobility Bed Mobility: Yes Rolling  Left: 2: Max assist Rolling Left Details (indicate cue type and reason): Pt unable to fully roll L d/t pain. Cues for reaching hand, pushing with bent L LE, but unable to perform the smallest movement from supine, yelling.  Supine to Sit: 1: +1 Total assist;HOB elevated (Comment degrees);With rails Supine to Sit Details (indicate cue type and reason): HOB fully upright, using pad to turn pt to EOB with increased pain behaviors, but willing to proceed. Pt able to assist with LUE on rail, and reach EOB with LUE.  Sitting - Scoot to Edge of Bed: 2: Max assist Sitting - Scoot to [employer] of Bed Details (indicate cue type and reason): Cues to initiate.  Transfers Transfers: Yes Sit to Stand: 3: Mod assist;From elevated surface;With upper extremity assist;From bed;With armrests Sit to Stand Details (indicate cue type and reason): Cues for hand placement and technique.  Stand to Sit: 3: Mod assist;With upper extremity assist;To chair/3-in-1;With armrests Stand to Sit Details: Cues for hand placement, and encouragement to initiate. Increased time required.  Ambulation/Gait Ambulation/Gait: Yes Ambulation/Gait Assistance: 3: Mod assist Ambulation/Gait Assistance Details (indicate cue type and reason): Cues for step-by-step sequence and hand placement on grips. Assist for steadying and RW managment.  Ambulation Distance (Feet): 5 Feet Assistive device: Rolling walker Gait Pattern: Step-to pattern;Decreased stride length;Shuffle Stairs: No Wheelchair Mobility Wheelchair Mobility: No  Posture/Postural Control Posture/Postural Control: No significant limitations Balance Balance Assessed: Yes Static Sitting Balance Static Sitting - Balance Support: Bilateral upper extremity supported;Feet unsupported Static Sitting - Level of Assistance: 4: Min assist Static Sitting - Comment/# of Minutes: Min A for steadying as pt tends to lean backwards, esp with increased pain. x87min Static Standing Balance Static  Standing - Balance Support: Bilateral upper extremity supported;During functional activity Static Standing - Level of Assistance: 4: Min assist Static Standing - Comment/# of Minutes: Min  A for steadying x5 min while changing briefs and performing personal hygiene. Pt tends to lean posteriorly.  Exercise  General Exercises - Lower Extremity Ankle Circles/Pumps: 20 reps;Both;Strengthening;AROM;Seated Long Arc Quad: Seated;10 reps;Both;Strengthening;AROM Hip Flexion/Marching: Seated;10 reps;Both;Strengthening;AROM End of Session PT - End of Session Equipment Utilized During Treatment: Gait belt Activity Tolerance: Patient limited by pain Patient left: in chair;with call bell in reach Nurse Communication: Mobility status for ambulation;Mobility status for transfers General Behavior During Session: Other (comment) (Hesitant to participate and with decreased cognition) Cognition: Impaired  Virl Cagey, PT [phone]  10/19/2011, 11:27 AM

## 2011-10-19 NOTE — Progress Notes (Addendum)
Subjective: Denies any complaints, but very poor historian, upset about being in hospital  Objective: Vital signs in last 24 hours: Temp:  [97.2 F (36.2 C)-99.4 F (37.4 C)] 97.7 F (36.5 C) (03/19 0933) Pulse Rate:  [93-116] 100  (03/19 0933) Resp:  [14-24] 20  (03/19 0933) BP: (137-173)/(67-84) 150/79 mmHg (03/19 0933) SpO2:  [91 %-97 %] 97 % (03/19 0933) Weight:  [52.2 kg (115 lb 1.3 oz)] 52.2 kg (115 lb 1.3 oz) (03/18 2244) Weight change:  Last BM Date:  (prior to admission)  Intake/Output from previous day:     Physical Exam: General: Alert, awake, oriented to place, person,  in no acute distress. HEENT: No bruits, no goiter. Heart: Regular rate and rhythm, without murmurs, rubs, gallops. Lungs: Clear to auscultation bilaterally. Abdomen: Soft, nontender, nondistended, positive bowel sounds, slightly distended Extremities: Both Knees with mesh wraps, no cyanosis or edema with positive pedal pulses. Neuro: cranial nerves intact, motor strength 4/5 in all extremities, plantars withdrawal  Lab Results: Basic Metabolic Panel:  Basename 10/19/11 0628 10/18/11 1543  NA 137 136  K 3.9 4.0  CL 100 97  CO2 27 26  GLUCOSE 136* 98  BUN 13 19  CREATININE 0.59 0.62  CALCIUM 8.5 9.5  MG -- --  PHOS -- --   Liver Function Tests:  [facility] 10/18/11 1543  AST 33  ALT 35  ALKPHOS 98  BILITOT 0.3  PROT 6.7  ALBUMIN 3.5    Basename 10/18/11 1543  LIPASE 24  AMYLASE --   No results found for this basename: AMMONIA:2 in the last 72 hours CBC:  Basename 10/19/11 0628 10/18/11 1543  WBC 5.4 8.3  NEUTROABS -- 6.4  HGB 12.2 13.5  HCT 36.2 39.4  MCV 86.8 86.0  PLT 251 262   Cardiac Enzymes:  Basename 10/18/11 1544  CKTOTAL --  CKMB --  CKMBINDEX --  TROPONINI <0.30   BNP: No results found for this basename: PROBNP:3 in the last 72 hours D-Dimer: No results found for this basename: DDIMER:2 in the last 72 hours CBG: No results found for this basename:  GLUCAP:6 in the last 72 hours Hemoglobin A1C: No results found for this basename: HGBA1C in the last 72 hours Fasting Lipid Panel: No results found for this basename: CHOL,HDL,LDLCALC,TRIG,CHOLHDL,LDLDIRECT in the last 72 hours Thyroid Function Tests: No results found for this basename: TSH,T4TOTAL,FREET4,T3FREE,THYROIDAB in the last 72 hours Anemia Panel: No results found for this basename: VITAMINB12,FOLATE,FERRITIN,TIBC,IRON,RETICCTPCT in the last 72 hours Coagulation: No results found for this basename: LABPROT:2,INR:2 in the last 72 hours Urine Drug Screen: Drugs of Abuse  No results found for this basename: labopia, cocainscrnur, labbenz, amphetmu, thcu, labbarb    Alcohol Level: No results found for this basename: ETH:2 in the last 72 hours Urinalysis:  Basename 10/18/11 1824  COLORURINE YELLOW  LABSPEC 1.012  PHURINE 7.0  GLUCOSEU NEGATIVE  HGBUR NEGATIVE  BILIRUBINUR NEGATIVE  KETONESUR 40*  PROTEINUR NEGATIVE  UROBILINOGEN 0.2  NITRITE NEGATIVE  LEUKOCYTESUR NEGATIVE    No results found for this or any previous visit (from the past 240 hour(s)).  Studies/Results: Ct Head Wo Contrast  10/18/2011  *RADIOLOGY REPORT*  Clinical Data: Altered mental status.  Confusion.  CT HEAD WITHOUT CONTRAST  Technique:  Contiguous axial images were obtained from the base of the skull through the vertex without contrast.  Comparison: None.  Findings: No intracranial hemorrhage.  No CT evidence of large acute infarct.  No intracranial mass lesion detected on this unenhanced exam.  Small  vessel disease type changes.  Global atrophy without hydrocephalus.  Vascular calcifications.  Question minimal partial opacification inferior right mastoid air cells.  IMPRESSION: No acute abnormality.  Original Report Authenticated By: Fuller Canada, M.D.   Dg Abd Acute W/chest  10/18/2011  *RADIOLOGY REPORT*  Clinical Data: Abdominal pain.  Confusion  ACUTE ABDOMEN SERIES (ABDOMEN 2 VIEW & CHEST 1  VIEW)  Comparison: 10/14/2011  Findings: There is marked asymmetric elevation of the left hemidiaphragm.  This is similar to previous exam.  The heart size is normal.  No pleural effusion or edema.  Pulmonary venous congestion is identified which appears similar to previous exam.  No airspace consolidation identified.  There is a marked amount of desiccated stool identified within the colon and rectum. Rectal impaction is suspected.  No dilated loops of small bowel or fluid levels identified.  IMPRESSION:  1.  No acute cardiopulmonary abnormalities. 2.  Suspect rectal impaction with constipation.  Original Report Authenticated By: Rosealee Albee, M.D.    Medications: Scheduled Meds:   . amLODipine  10 mg Oral Daily  . aspirin  81 mg Oral Daily  . enoxaparin  40 mg Subcutaneous Q24H  . famotidine  60 mg Oral Daily  . fentaNYL  50 mcg Intravenous Once  . fentaNYL  50 mcg Intravenous Once  .  morphine injection  2 mg Intravenous Once  . ondansetron  4 mg Intravenous Once  . polyvinyl alcohol  1 drop Both Eyes TID  . DISCONTD: Polyethyl Glycol-Propyl Glycol  1 drop Ophthalmic TID   Continuous Infusions:   . sodium chloride 125 mL/hr at 10/18/11 1557  . DISCONTD: dextrose 5 % and 0.9% NaCl 100 mL/hr (10/19/11 0007)   PRN Meds:.acetaminophen, acetaminophen, bisacodyl, ketorolac, magnesium citrate  Assessment/Plan: 1. 1. AMS/Confusion,  Suspect related to Recent Intra-articular steroids +/_ Tramadol Unfortunately intra-articular steroids are depot preparation and effects can last for weeks No evidence of any infection or electrolyte derangement that can cause encephalopathy Also check TSH/B12/RPR 2. Constipation: miralax, had CT 3/15 negative 3. L1 compression fracture: Physical therapy, tylenol PRN, if not controlled with this could try celebrex 200mg  BID for 3-4days,  Pain control does not appear to be a big issue now, so doubt she will require Kyphoplasty Avoid narcotics if  possible 4. Pt/OT Further management as condition resolves DNR Called and updated daughter today    LOS: 1 day   Jayonna Meyering [facility]  10/19/2011, 10:16 AM  Addendum: Called by RN, as patient was seen holding her LuQ and appeared to be writhing in pain Very poor historian, does admit to pain now, denied it earlier in the day. Will repeat CT abd/Pelvis, this was unremarkable on 3/15 Change diet to NPO till CT Morphine PRN, until acute abdominal issues sorted out   Zannie Cove, MD [phone]

## 2011-10-20 ENCOUNTER — Encounter: Payer: Medicare Other | Admitting: Cardiothoracic Surgery

## 2011-10-20 LAB — BASIC METABOLIC PANEL
BUN: 10 mg/dL (ref 6–23)
CO2: 26 mEq/L (ref 19–32)
Calcium: 8.7 mg/dL (ref 8.4–10.5)
Creatinine, Ser: 0.59 mg/dL (ref 0.50–1.10)

## 2011-10-20 LAB — CBC
HCT: 38.5 % (ref 36.0–46.0)
MCH: 29.1 pg (ref 26.0–34.0)
MCV: 87.5 fL (ref 78.0–100.0)
Platelets: 255 10*3/uL (ref 150–400)
RBC: 4.4 MIL/uL (ref 3.87–5.11)

## 2011-10-20 MED ORDER — SENNOSIDES-DOCUSATE SODIUM 8.6-50 MG PO TABS
2.0000 | ORAL_TABLET | Freq: Two times a day (BID) | ORAL | Status: DC
  Administered 2011-10-20 – 2011-10-27 (×12): 2 via ORAL
  Filled 2011-10-20 (×15): qty 2

## 2011-10-20 MED ORDER — MORPHINE SULFATE 2 MG/ML IJ SOLN
1.0000 mg | INTRAMUSCULAR | Status: DC
  Administered 2011-10-20 – 2011-10-21 (×3): 1 mg via INTRAVENOUS
  Filled 2011-10-20 (×3): qty 1

## 2011-10-20 MED ORDER — FLEET ENEMA 7-19 GM/118ML RE ENEM
1.0000 | ENEMA | Freq: Once | RECTAL | Status: AC
  Administered 2011-10-20: 1 via RECTAL
  Filled 2011-10-20: qty 1

## 2011-10-20 MED ORDER — ERGOCALCIFEROL 8000 UNIT/ML PO SOLN
2000.0000 [IU] | Freq: Every day | ORAL | Status: DC
  Filled 2011-10-20: qty 0.25

## 2011-10-20 MED ORDER — CALCIUM CARBONATE ANTACID 500 MG PO CHEW
1.0000 | CHEWABLE_TABLET | Freq: Two times a day (BID) | ORAL | Status: DC
  Administered 2011-10-20 – 2011-10-27 (×14): 200 mg via ORAL
  Filled 2011-10-20 (×16): qty 1

## 2011-10-20 MED ORDER — SORBITOL 70 % SOLN
10.0000 mL | Freq: Two times a day (BID) | Status: DC
  Administered 2011-10-20 – 2011-10-23 (×5): 10 mL via ORAL
  Filled 2011-10-20 (×7): qty 30

## 2011-10-20 MED ORDER — IBUPROFEN 400 MG PO TABS
400.0000 mg | ORAL_TABLET | Freq: Four times a day (QID) | ORAL | Status: DC | PRN
  Administered 2011-10-23: 400 mg via ORAL
  Filled 2011-10-20 (×3): qty 1

## 2011-10-20 MED ORDER — VITAMIN D3 25 MCG (1000 UNIT) PO TABS
2000.0000 [IU] | ORAL_TABLET | Freq: Every day | ORAL | Status: DC
  Administered 2011-10-21 – 2011-10-27 (×7): 2000 [IU] via ORAL
  Filled 2011-10-20 (×7): qty 2

## 2011-10-20 MED ORDER — SENNOSIDES-DOCUSATE SODIUM 8.6-50 MG PO TABS
2.0000 | ORAL_TABLET | Freq: Every day | ORAL | Status: DC
  Administered 2011-10-20: 2 via ORAL
  Filled 2011-10-20 (×2): qty 2

## 2011-10-20 NOTE — Progress Notes (Signed)
PROGRESS NOTE  Beverly Adkins ZOX:[id number] DOB: 05/06/1925 DOA: 10/18/2011 PCP: Fredderick Severance, MD, MD  Brief narrative: 78 year old female presented to hospital 3/18 with altered mental status. Found to have compression fracture L1 and also rib fractures this admission  Past medical history: Anxiety, carotid stenosis, elevated left hemidiaphragm, recent fractures that are new without any inciting event (fall etc.)  Consultants:  None  Procedures:  CT chest 3/14 = elevated left hemidiaphragm, small hiatal hernia, marked compression deformity of L1 vertebral body with diffuse degenerative change  CT abdomen 3/14 = no intra-abdominal abnormality with large amount of feces throughout. Tortuous abdominal aorta with atheromatous change but no aneurysm  Ct Brain=No acute Abnormality  CT Abd and Pelvis 3/18=new rib #'s on the Left 7th/8th ribs posteriorly, L Lower lobe atelectasis, Chornis elevation of L hemidiaphragn, Constipation with large stool ball in the rectum  Antibiotics:  None   Subjective  Pleasant.  In severe pain when moving.  Other than that seems a lot more oriented than prior. No other specific complaints   Objective   Interim History: Chart reviewed in entirety.  Objective: Filed Vitals:   10/19/11 1605 10/19/11 2157 10/20/11 0502 10/20/11 1435  BP: 150/82 157/85 150/88 129/67  Pulse: 99 92 90 86  Temp: 97.2 F (36.2 C) 97.5 F (36.4 C) 96.9 F (36.1 C) 97.7 F (36.5 C)  TempSrc: Oral Oral Oral Oral  Resp: 20 18 18 18   Height:   4\' 11"  (1.499 m)   Weight:   51.8 kg (114 lb 3.2 oz)   SpO2: 97% 97% 96% 95%    Intake/Output Summary (Last 24 hours) at 10/20/11 1505 Last data filed at 10/20/11 0600  Gross per 24 hour  Intake   1347 ml  Output      0 ml  Net   1347 ml    Exam:   General: Alert and pleasant CF Cardiovascular:  s1 s2 no m/r/g. Respiratory: clinically clear.  No added sound.  Significant pain to the L lower ribs Abdomen: soft,  nt/nd Skin No lower abd pain  Neuro-Grossly intact, Moves all 4 limbs equally  Data Reviewed: Basic Metabolic Panel:  Lab 10/20/11 8119 10/19/11 0628 10/18/11 1543  NA 135 137 136  K 3.9 3.9 --  CL 99 100 97  CO2 26 27 26   GLUCOSE 103* 136* 98  BUN 10 13 19   CREATININE 0.59 0.59 0.62  CALCIUM 8.7 8.5 9.5  MG -- -- --  PHOS -- -- --   Liver Function Tests:  Lab 10/18/11 1543  AST 33  ALT 35  ALKPHOS 98  BILITOT 0.3  PROT 6.7  ALBUMIN 3.5    Lab 10/18/11 1543  LIPASE 24  AMYLASE --   No results found for this basename: AMMONIA:5 in the last 168 hours CBC:  Lab 10/20/11 0630 10/19/11 0628 10/18/11 1543  WBC 6.9 5.4 8.3  NEUTROABS -- -- 6.4  HGB 12.8 12.2 13.5  HCT 38.5 36.2 39.4  MCV 87.5 86.8 86.0  PLT 255 251 262   Cardiac Enzymes:  Lab 10/18/11 1544  CKTOTAL --  CKMB --  CKMBINDEX --  TROPONINI <0.30   BNP: No components found with this basename: POCBNP:5 CBG: No results found for this basename: GLUCAP:5 in the last 168 hours  No results found for this or any previous visit (from the past 240 hour(s)).   Studies:              All Imaging reviewed and is as per  above notation   Scheduled Meds:   . amLODipine  10 mg Oral Daily  . aspirin  81 mg Oral Daily  . enoxaparin  40 mg Subcutaneous Q24H  . famotidine  60 mg Oral Daily  . iohexol  20 mL Oral Q1 Hr x 2  .  morphine injection  1 mg Intravenous Q4H  . morphine      . polyethylene glycol  17 g Oral Daily  . polyvinyl alcohol  1 drop Both Eyes TID  . senna-docusate  2 tablet Oral QHS  . sodium phosphate  1 enema Rectal Once   Continuous Infusions:   . sodium chloride 20 mL/hr at 10/20/11 0600     Assessment/Plan: 1. Confusion-Likely 2/2 to Tramadol, possibly due to Depot Steroid injections given last Friday into knee.  Will attempt to minimize opiates but likely will need at least some IV opiates short-term given the fact that she is in such severe pain. 2. Rib fracture+Li  compression #-no inciting event. High threshold for workup given totally normal CBC, normal coags. Will add LFTs to do more his labs but likely this is all related to osteopenia which has been documented in the past and probably was not helped by her the Knee injection. 3. Osteoporosis-will place on calcium carbonate and vitamin D, knowing fully well calcium may constipate her 4. Significant constipation-we'll attempt to minimize opiates as per above and other medications. I placed her on sorbitol. Manual disimpaction was done with poor result given she had a Fleet enema. Will double her dose of Senokot to help with expulsion. 5. Left hemidiaphragm elevation-seen by Dr. Janice Norrie right in the past for possible surgery of this, secondary to shortness of breath and choking feeling when bending over especially after eating meals. for outpatient followup  Code Status: Full  Family Communication: Daughters in room-discussed with both of them POC Disposition Plan: SNF likely   Pleas Koch, MD  [facility] Pager [phone] 10/20/2011, 3:05 PM    LOS: 2 days

## 2011-10-20 NOTE — Progress Notes (Signed)
Per MD order, manually disimpacted patient.  Patient tolerated the procedure poorly.  She found the procedure very uncomfortable.  A small amount of stool was removed.  However, observing by palpation, there was a significant amount of stool left in her rectum.  Patient asked to stop.  Dr. Mahala Menghini was made aware of the results.

## 2011-10-21 MED ORDER — OXYCODONE-ACETAMINOPHEN 5-325 MG PO TABS
1.0000 | ORAL_TABLET | Freq: Four times a day (QID) | ORAL | Status: DC
  Administered 2011-10-21 – 2011-10-27 (×17): 1 via ORAL
  Filled 2011-10-21 (×18): qty 1

## 2011-10-21 NOTE — Progress Notes (Signed)
CSW received referral for SNF placement. Initiated SNF search and will follow to facilitate placement. Baxter Flattery, MSW [phone]

## 2011-10-21 NOTE — Progress Notes (Signed)
PROGRESS NOTE  Beverly Adkins WUJ:[id number] DOB: [birth year] DOA: 10/18/2011 PCP: Fredderick Severance, MD, MD  Brief narrative: 78 year old female presented to hospital 3/18 with altered mental status.  Found to have compression fracture L1 and also rib fractures this admission  Past medical history: Anxiety, carotid stenosis, elevated left hemidiaphragm, recent fractures that are new without any inciting event (fall etc.)  Consultants:  None  Procedures:  CT chest 3/14 = elevated left hemidiaphragm, small hiatal hernia, marked compression deformity of L1 vertebral body with diffuse degenerative change  CT abdomen 3/14 = no intra-abdominal abnormality with large amount of feces throughout. Tortuous abdominal aorta with atheromatous change but no aneurysm  Ct Brain=No acute Abnormality  CT Abd and Pelvis 3/18=new rib #'s on the Left 7th/8th ribs posteriorly, L Lower lobe atelectasis, Chornis elevation of L hemidiaphragn, Constipation with large stool ball in the rectum  Antibiotics:  None   Subjective  Pleasant.  Pain seems to be decreased compared to prior. Was able to move a little bit more but very slowly. No other specific complaints. Numerous questions about placement and wishes [facility]. Not passing stool but passing gas   Objective   Interim History: Chart reviewed in entirety.  Objective: Filed Vitals:   10/20/11 0502 10/20/11 1435 10/20/11 2100 10/21/11 0500  BP: 150/88 129/67 128/76 134/77  Pulse: 90 86 106 88  Temp: 96.9 F (36.1 C) 97.7 F (36.5 C) 97.8 F (36.6 C) 98.1 F (36.7 C)  TempSrc: Oral Oral Oral Oral  Resp: 18 18 20 20   Height: 4\' 11"  (1.499 m)     Weight: 51.8 kg (114 lb 3.2 oz)   50.6 kg (111 lb 8.8 oz)  SpO2: 96% 95% 92% 91%    Intake/Output Summary (Last 24 hours) at 10/21/11 0911 Last data filed at 10/20/11 2303  Gross per 24 hour  Intake     60 ml  Output      1 ml  Net     59 ml    Exam:   General: Alert and pleasant  CF Cardiovascular:  s1 s2 no m/r/g. Respiratory: clinically clear.  No added sound.  Significant pain to the L lower ribs  Abdomen: soft, nt/nd Skin No lower abd pain  Neuro-Grossly intact, Moves all 4 limbs equally  Data Reviewed: Basic Metabolic Panel:  Lab 10/20/11 9562 10/19/11 0628 10/18/11 1543  NA 135 137 136  K 3.9 3.9 --  CL 99 100 97  CO2 26 27 26   GLUCOSE 103* 136* 98  BUN 10 13 19   CREATININE 0.59 0.59 0.62  CALCIUM 8.7 8.5 9.5  MG -- -- --  PHOS -- -- --   Liver Function Tests:  Lab 10/18/11 1543  AST 33  ALT 35  ALKPHOS 98  BILITOT 0.3  PROT 6.7  ALBUMIN 3.5    Lab 10/18/11 1543  LIPASE 24  AMYLASE --   No results found for this basename: AMMONIA:5 in the last 168 hours CBC:  Lab 10/20/11 0630 10/19/11 0628 10/18/11 1543  WBC 6.9 5.4 8.3  NEUTROABS -- -- 6.4  HGB 12.8 12.2 13.5  HCT 38.5 36.2 39.4  MCV 87.5 86.8 86.0  PLT 255 251 262   Cardiac Enzymes:  Lab 10/18/11 1544  CKTOTAL --  CKMB --  CKMBINDEX --  TROPONINI <0.30   BNP: No components found with this basename: POCBNP:5 CBG: No results found for this basename: GLUCAP:5 in the last 168 hours  No results found for this or any previous visit (  from the past 240 hour(s)).   Studies:              All Imaging reviewed and is as per above notation   Scheduled Meds:    . amLODipine  10 mg Oral Daily  . aspirin  81 mg Oral Daily  . calcium carbonate  1 tablet Oral BID WC  . cholecalciferol  2,000 Units Oral Daily  . enoxaparin  40 mg Subcutaneous Q24H  . famotidine  60 mg Oral Daily  .  morphine injection  1 mg Intravenous Q4H  . polyvinyl alcohol  1 drop Both Eyes TID  . senna-docusate  2 tablet Oral BID  . sorbitol  10 mL Oral BID  . DISCONTD: ergocalciferol  2,000 Units Oral Daily  . DISCONTD: polyethylene glycol  17 g Oral Daily  . DISCONTD: senna-docusate  2 tablet Oral QHS   Continuous Infusions:    . DISCONTD: sodium chloride 20 mL/hr at 10/20/11 0600      Assessment/Plan: 1. Confusion-Likely 2/2 to Tramadol, possibly due to Depot Steroid injections given last Friday into knee.  Have changed her to Percocet 5/325 every 6 when necessary 2. Rib fracture+Li compression #-no inciting event. High threshold for workup given totally normal CBC, normal coags. Will add LFTs to do more his labs but likely this is all related to osteopenia which has been documented in the past and probably was not helped by her the Knee injection with steroids- Will have her setup and out of bed. Patient will need a nursing facility 3. Osteoporosis-will place on calcium carbonate and vitamin D, knowing fully well calcium may constipate her 4. Significant constipation-we'll attempt to minimize opiates as per above and other medications. I placed her on sorbitol. Manual disimpaction was done with poor result given she had a Fleet enema. Will double her dose of Senokot to help with expulsion. 5. Left hemidiaphragm elevation-seen by Dr. Janice Norrie right in the past for possible surgery of this, secondary to shortness of breath and choking feeling when bending over especially after eating meals. for outpatient followup  Code Status: Full  Family Communication: Daughters in room-discussed with both of them POC Disposition Plan: SNF likely   Pleas Koch, MD  [facility] Pager [phone] 10/21/2011, 9:11 AM    LOS: 3 days

## 2011-10-21 NOTE — Evaluation (Signed)
Occupational Therapy Evaluation Patient Details Name: Beverly Adkins MRN: [id number] DOB: 06/19/1925 Today's Date: 10/21/2011  Problem List:  Patient Active Problem List  Diagnoses  . Elevated hemidiaphragm  . Anxiety  . Abrasion of cornea, left  . Aphthous ulcer  . Carotid artery stenosis  . GERD (gastroesophageal reflux disease)  . Hyperkalemia  . Osteoporosis  . Peripheral neuropathy  . Incontinence of urine  . Congenital anomaly of diaphragm  . Altered mental status  . Constipation  . Fracture lumbar vertebra-closed    Past Medical History:  Past Medical History  Diagnosis Date  . Elevated hemidiaphragm     Left  . Anxiety   . Abrasion of cornea, left   . Aphthous ulcer   . Carotid artery stenosis   . GERD (gastroesophageal reflux disease)   . Hyperkalemia   . Osteoarthritis   . Osteoporosis   . Peripheral neuropathy   . Incontinence of urine     urge  . Anal ulcer   . Fracture of femur, shaft     closed, Right   Past Surgical History:  Past Surgical History  Procedure Date  . Cataract extraction   . Cesarean section   . Foot surgery   . Hip surgery     right, Dr. Magnus Ivan  . Neuroplasty decompression median nerve at carpal tunnel     OT Assessment/Plan/Recommendation OT Assessment Clinical Impression Statement:  78 year old female presented to hospital 3/18 with altered mental status. Found to have compression fracture L1 and also rib fractures this admission.  Pt is max dependent in mobility and most ADL due to rib pain.  Will follow acutely to facilitate return to prior functioning level.  Recommend SNF upon d/c.     OT Recommendation/Assessment: Patient will need skilled OT in the acute care venue OT Problem List: Decreased strength;Decreased range of motion;Decreased activity tolerance;Impaired balance (sitting and/or standing);Decreased cognition;Decreased knowledge of use of DME or AE;Pain;Impaired UE functional use Barriers to Discharge:  Decreased caregiver support OT Therapy Diagnosis : Generalized weakness;Cognitive deficits;Acute pain OT Plan OT Frequency: Min 1X/week OT Treatment/Interventions: Self-care/ADL training;DME and/or AE instruction;Patient/family education;Balance training OT Recommendation Follow Up Recommendations: Skilled nursing facility Equipment Recommended: Defer to next venue Individuals Consulted Consulted and Agree with Results and Recommendations: Patient OT Goals Acute Rehab OT Goals OT Goal Formulation: With patient Time For Goal Achievement: 2 weeks ADL Goals Pt Will Perform Grooming: with set-up;Standing at sink;Other (comment) (min assist to stand at sink for 2 activities) ADL Goal: Grooming - Progress: Goal set today Pt Will Perform Upper Body Bathing: with min assist;Sitting, edge of bed ADL Goal: Upper Body Bathing - Progress: Goal set today Pt Will Perform Lower Body Bathing: with mod assist;Sit to stand from bed;Sitting, edge of bed ADL Goal: Lower Body Bathing - Progress: Goal set today Pt Will Perform Upper Body Dressing: with min assist;Sitting, bed ADL Goal: Upper Body Dressing - Progress: Goal set today Pt Will Perform Lower Body Dressing: with mod assist;Sitting, bed;Sit to stand from bed ADL Goal: Lower Body Dressing - Progress: Goal set today Pt Will Transfer to Toilet: with min assist;Ambulation;3-in-1 (over toilet) ADL Goal: Toilet Transfer - Progress: Goal set today  OT Evaluation Precautions/Restrictions  Precautions Precautions: Fall Required Braces or Orthoses: No Restrictions Weight Bearing Restrictions: No Prior Functioning Home Living Lives With: Alone Receives Help From: Family Type of Home: House Home Layout: One level Home Access: Level entry Bathroom Shower/Tub: Engineer, manufacturing systems: Standard Home Adaptive Equipment: Bedside commode/3-in-1;Shower chair with back;Walker -  rolling;Walker - four wheeled;Grab bars in shower;Grab bars around  toilet Prior Function Level of Independence: Requires assistive device for independence Able to Take Stairs?: Yes Driving: No ADL ADL Eating/Feeding: Set up;Simulated Eating/Feeding Details (indicate cue type and reason): can self feed if tray is very close Where Assessed - Eating/Feeding: Chair Grooming: Performed;Wash/dry face;Brushing hair;Set up;Moderate assistance Grooming Details (indicate cue type and reason): set up for brushing teeth, assist for hair. Where Assessed - Grooming: Sitting, chair Upper Body Bathing: Performed;Maximal assistance Where Assessed - Upper Body Bathing: Sitting, chair Lower Body Bathing: Performed;+1 Total assistance Where Assessed - Lower Body Bathing: Sitting, chair;Sit to stand from chair Upper Body Dressing: Performed;Maximal assistance Where Assessed - Upper Body Dressing: Sitting, chair Lower Body Dressing: Performed;+1 Total assistance Where Assessed - Lower Body Dressing: Sitting, chair;Sit to stand from chair ADL Comments: ADL limited by pt's pain. Vision/Perception  Vision - History Patient Visual Report: No change from baseline Cognition Cognition Arousal/Alertness: Awake/alert Overall Cognitive Status: Impaired Orientation Level: Oriented X4 Cognition - Other Comments:  (decreased memory for recent events) Sensation/Coordination Sensation Light Touch: Appears Intact Hot/Cold: Appears Intact Proprioception: Appears Intact Coordination Gross Motor Movements are Fluid and Coordinated: No Fine Motor Movements are Fluid and Coordinated: No Extremity Assessment RUE Assessment RUE Assessment: Exceptions to WFL (OA changes in hand, shoulder ROM limited by pain) LUE Assessment LUE Assessment: Exceptions to [facility] (OA changes in hands, pain limiting active shoulder ROM) Mobility  Bed Mobility Bed Mobility: No Rolling Right: 1: +1 Total assist Rolling Right Details (indicate cue type and reason): Pt limited by pain.  Needed repeated cues  to flex knees and reach to facilitate. Rolling Left: 1: +1 Total assist Rolling Left Details (indicate cue type and reason): Pt limited by pain.  Needed repeated cues to flex knees and reach to facilitate. Supine to Sit: 1: +1 Total assist;HOB elevated (Comment degrees) (HOB 20 degrees) Supine to Sit Details (indicate cue type and reason): Verbal/tactile cues for technique.  Pt yelling out with movement. Sitting - Scoot to Edge of Bed: 3: Mod assist Sitting - Scoot to Edge of Bed Details (indicate cue type and reason): Use of pad Transfers Transfers: Yes Sit to Stand: 2: Max assist;From chair/3-in-1;With armrests Stand to Sit: 3: Mod assist;With upper extremity assist;To chair/3-in-1 End of Session OT - End of Session Equipment Utilized During Treatment: Gait belt Activity Tolerance: Patient limited by pain Patient left: in chair;with call bell in reach;with family/visitor present General Behavior During Session: [facility] for tasks performed Cognition: [facility] for tasks performed   Evern Bio 10/21/2011, 2:19 PM [phone]

## 2011-10-21 NOTE — Progress Notes (Signed)
Physical Therapy Treatment Patient Details Name: Beverly Adkins MRN: [id number] DOB: 25-Jul-1925 Today's Date: 10/21/2011  PT Assessment/Plan  PT - Assessment/Plan Comments on Treatment Session: Pt with very limited mobility due to rib pain. PT Plan: Discharge plan remains appropriate;Frequency needs to be updated PT Frequency: Min 2X/week Follow Up Recommendations: Skilled nursing facility Equipment Recommended: Defer to next venue PT Goals  Acute Rehab PT Goals PT Goal: Rolling Supine to Left Side - Progress: Not progressing PT Goal: Supine/Side to Sit - Progress: Not progressing PT Goal: Sit to Stand - Progress: Not progressing PT Goal: Stand to Sit - Progress: Not progressing PT Transfer Goal: Bed to Chair/Chair to Bed - Progress: Not progressing PT Goal: Ambulate - Progress: Not progressing  PT Treatment Precautions/Restrictions  Precautions Precautions: Fall Required Braces or Orthoses: No Restrictions Weight Bearing Restrictions: No Mobility (including Balance) Bed Mobility Rolling Right: 1: +1 Total assist Rolling Right Details (indicate cue type and reason): Pt limited by pain.  Needed repeated cues to flex knees and reach to facilitate. Rolling Left: 1: +1 Total assist Rolling Left Details (indicate cue type and reason): Pt limited by pain.  Needed repeated cues to flex knees and reach to facilitate. Supine to Sit: 1: +1 Total assist;HOB elevated (Comment degrees) (HOB 20 degrees) Supine to Sit Details (indicate cue type and reason): Verbal/tactile cues for technique.  Pt yelling out with movement. Sitting - Scoot to Edge of Bed: 3: Mod assist Sitting - Scoot to Edge of Bed Details (indicate cue type and reason): Use of pad Transfers Squat Pivot Transfers: 3: Mod assist Squat Pivot Transfer Details (indicate cue type and reason): Pt needed manual facilitation to clear hips over armrest of chair.  Facilitated with hands under buttocks.  Static Sitting Balance Static  Sitting - Balance Support: Right upper extremity supported Static Sitting - Level of Assistance: 5: Stand by assistance Exercise    End of Session PT - End of Session Activity Tolerance: Patient limited by pain Patient left: in chair;with call bell in reach Nurse Communication: Mobility status for transfers General Behavior During Session: [facility] for tasks performed Cognition: [facility] for tasks performed  [facility] 10/21/2011, 11:34 AM  [facility] [phone]

## 2011-10-22 MED ORDER — OXYCODONE-ACETAMINOPHEN 5-325 MG PO TABS: 1.0000 | ORAL_TABLET | Freq: Four times a day (QID) | ORAL | Status: DC

## 2011-10-22 MED ORDER — CALCIUM CARBONATE ANTACID 500 MG PO CHEW: 1.0000 | CHEWABLE_TABLET | Freq: Two times a day (BID) | ORAL | Status: AC

## 2011-10-22 MED ORDER — SORBITOL 70 % SOLN: 20.0000 mL | Freq: Two times a day (BID) | Status: DC

## 2011-10-22 MED ORDER — VITAMIN D3 25 MCG (1000 UNIT) PO TABS: 2000.0000 [IU] | ORAL_TABLET | Freq: Every day | ORAL | Status: AC

## 2011-10-22 MED ORDER — SENNOSIDES-DOCUSATE SODIUM 8.6-50 MG PO TABS: 2.0000 | ORAL_TABLET | Freq: Two times a day (BID) | ORAL | Status: AC

## 2011-10-22 MED ORDER — IBUPROFEN 400 MG PO TABS: 400.0000 mg | ORAL_TABLET | Freq: Four times a day (QID) | ORAL | Status: AC | PRN

## 2011-10-22 NOTE — Progress Notes (Signed)
CSW provided bed offers to pt daughters, explained placement process and answered many questions. Pt daughters requesting placement at either 1)[facility] or 2)[facility] when pt is medically ready.  Anticipate d/c Monday and CSW will follow to facilitate transfer by [facility]AR.  CSW provided Advance Directives packet, per pt daughter request.  Baxter Flattery, MSW [phone]

## 2011-10-22 NOTE — Discharge Summary (Signed)
[facility] Hospital Discharge Summary Date of Admission: 10/18/2011  1:26 PM Admitter: @ADMITPROV @   Date of Discharge3/22/2013 Attending Physician: Rhetta Mura, MD  Please reassess her pain level daily-she might need IV pain medications to control her pain Please address constipation-she had manual disimpaction which is not successful in hospital. I've upped her dose of laxatives Will need outpatient followup for left hemidiaphragm elevation Would get a vitamin D level in about 20-30 days  Beverly Adkins ZOX:[id number] DOB: [birth year] DOA: 10/18/2011 PCP: Fredderick Severance, MD, MD  Brief narrative: 78 year old female presented to hospital 3/18 with altered mental status-she had recently been given a dose of steroids as a Depo injection but also in the emergency room was given Ultram and then fentanyl. A swallowing study was done and ultimately found that she was back to her baseline self and 24 hours.  Found to have compression fracture L1 and also rib fractures this admission-orthopedics was not consulted given the nature of her fall and lack of possible benefit with kyphoplasty. she was admitted to the hospital and did not have any other issues.  Past medical history: Anxiety, carotid stenosis, elevated left hemidiaphragm, recent fractures that are new without any inciting event (fall etc.), history of osteoporosis  Consultants:  None  Procedures:  CT chest 3/14 = elevated left hemidiaphragm, small hiatal hernia, marked compression deformity of L1 vertebral body with diffuse degenerative change   CT abdomen 3/14 = no intra-abdominal abnormality with large amount of feces throughout. Tortuous abdominal aorta with atheromatous change but no aneurysm   Ct Brain=No acute Abnormality   CT Abd and Pelvis 3/18=new rib #'s on the Left 7th/8th ribs posteriorly, L Lower lobe atelectasis, Chornis elevation of L hemidiaphragn, Constipation with large stool ball in the  rectum  Antibiotics:  None  Hospital Course by problem list: Assessment/Plan: 1. Confusion-Likely 2/2 to Tramadol and fentanyl given, possibly due to Depot Steroid injections given Friday prior to admission into knee.  although she completely resolved from a mental standpoint and there is no specific confusion, I do think she might have a little bit mild dementia and might need formal testing as an outpatient 2. Rib fracture+Li compression #-no inciting event. High threshold for workup given totally normal CBC, normal coags this admission. Likely this is all related to osteopenia which has been documented in the past and probably was not helped by her the Knee injection with steroids- she experienced significant pain on moving around in the bed and had difficulty with transfers to the point that physical therapy/occupational therapy felt it would be safest for her to pursue his short-term rehabilitation. Patient was grudgingly accepting of this and did not really wish to go but understood the importance of the same in full discussion was carried out with patient and family regarding this. 3. Osteoporosis-will place on calcium carbonate and vitamin D, knowing fully well calcium may constipate her-she does have a strong history of osteoporosis in the past and I have placed her on 2000 mg of vitamin D. in addition to calcium carbonate regularly. She will need a followup vitamin D level in about 30-40 days to ensure that she is therapeutic. Some studies have shown an inverse relationship between high vitamin D. level and frequency of falls, and although she has not experienced acute fall this may help her in the future. 4. Significant constipation-unfortunately her pain is poorly controlled on even minimal movement and she will require opiates. Patient has not experienced any confusion or any other issues at this  point but will need close monitoring and possible increasing her bowel regimen. I placed her on  sorbitol 70% 10 mL twice a day which have increased on discharge 20 mL twice a day.. Manual disimpaction was done with poor result given she had a Fleet enema and her stool was soft and not removable. Will double her dose of Senokot to help with expulsion.  I would recommend adding fleet enemas in one to 2 days if she does not experience of stool. Her lack of mobility as well as her being on calcium may also contribute to her constipation 5. Left hemidiaphragm elevation-seen by Dr. Morton Peters in the past for possible surgery of this, secondary to shortness of breath and choking feeling when bending over especially after eating meals. for outpatient followup-she would be an appropriate candidate for possible repair of this, but would recommend D. escalating her opiates prior to this procedure  Procedures Performed and pertinent labs: Ct Head Wo Contrast  10/18/2011  *RADIOLOGY REPORT*  Clinical Data: Altered mental status.  Confusion.  CT HEAD WITHOUT CONTRAST  Technique:  Contiguous axial images were obtained from the base of the skull through the vertex without contrast.  Comparison: None.  Findings: No intracranial hemorrhage.  No CT evidence of large acute infarct.  No intracranial mass lesion detected on this unenhanced exam.  Small vessel disease type changes.  Global atrophy without hydrocephalus.  Vascular calcifications.  Question minimal partial opacification inferior right mastoid air cells.  IMPRESSION: No acute abnormality.  Original Report Authenticated By: Fuller Canada, M.D.   Ct Chest W Contrast  10/14/2011  *RADIOLOGY REPORT*  Clinical Data:  Diaphragmatic hernia  CT CHEST AND ABDOMEN WITH CONTRAST  Technique:  Multidetector CT imaging of the chest and abdomen was performed following the standard protocol during bolus administration of intravenous contrast.  Contrast: OMNIPAQUE IOHEXOL 300 MG/ML IJ SOLN  Comparison:  Chest x-ray of 08/05/2010  CT CHEST  Findings:  On the lung window  images, mild chronic interstitial changes are noted.  No focal infiltrate or effusion is seen.  There is marked elevation of the left hemidiaphragm.  On bone window images the bones are diffusely osteopenic.  There is a vertebral plana of L1 with some retropulsion.  Degenerative disc disease is present at L2-L3 and at L4-L5 levels.  On soft tissue window images, the thoracic aorta and pulmonary arteries opacify with some atheromatous change of the aortic arch and descending thoracic aorta.  The left hemidiaphragm appears intact although markedly elevated possibly due to paralysis.  No mediastinal or hilar adenopathy or mass is seen.  There does appear to be a small hiatal hernia present.  IMPRESSION:  1.  Markedly elevated left hemidiaphragm.  No diaphragmatic hernia is seen.  No mediastinal or hilar mass or adenopathy is noted. 2.  Small hiatal hernia. 3.  Marked compression deformity of the L1 vertebral body with diffuse degenerative change.  CT ABDOMEN  Findings:  The liver enhances with no focal abnormality and no ductal dilatation is seen.  The gallbladder is visualized and no gallstones are noted.  The pancreas is normal in size and the pancreatic duct is not dilated although the pancreas is difficult to visualize well on this patient.  The adrenal glands are unremarkable as is the spleen.  The stomach is moderately fluid distended with no gross abnormality noted.  The kidneys enhance with no calculus or mass and no hydronephrosis is seen.  The abdominal aorta is tortuous with atheromatous change but  no aneurysm is seen.  No adenopathy is noted.  IMPRESSION:  1.  No intra-abdominal abnormality.  There is a large amount of feces throughout the colon.  2.  Tortuous  abdominal aorta with atheromatous change but no aneurysm.  Original Report Authenticated By: Juline Patch, M.D.   Ct Abdomen W Contrast  10/14/2011  *RADIOLOGY REPORT*  Clinical Data:  Diaphragmatic hernia  CT CHEST AND ABDOMEN WITH CONTRAST   Technique:  Multidetector CT imaging of the chest and abdomen was performed following the standard protocol during bolus administration of intravenous contrast.  Contrast: OMNIPAQUE IOHEXOL 300 MG/ML IJ SOLN  Comparison:  Chest x-ray of 08/05/2010  CT CHEST  Findings:  On the lung window images, mild chronic interstitial changes are noted.  No focal infiltrate or effusion is seen.  There is marked elevation of the left hemidiaphragm.  On bone window images the bones are diffusely osteopenic.  There is a vertebral plana of L1 with some retropulsion.  Degenerative disc disease is present at L2-L3 and at L4-L5 levels.  On soft tissue window images, the thoracic aorta and pulmonary arteries opacify with some atheromatous change of the aortic arch and descending thoracic aorta.  The left hemidiaphragm appears intact although markedly elevated possibly due to paralysis.  No mediastinal or hilar adenopathy or mass is seen.  There does appear to be a small hiatal hernia present.  IMPRESSION:  1.  Markedly elevated left hemidiaphragm.  No diaphragmatic hernia is seen.  No mediastinal or hilar mass or adenopathy is noted. 2.  Small hiatal hernia. 3.  Marked compression deformity of the L1 vertebral body with diffuse degenerative change.  CT ABDOMEN  Findings:  The liver enhances with no focal abnormality and no ductal dilatation is seen.  The gallbladder is visualized and no gallstones are noted.  The pancreas is normal in size and the pancreatic duct is not dilated although the pancreas is difficult to visualize well on this patient.  The adrenal glands are unremarkable as is the spleen.  The stomach is moderately fluid distended with no gross abnormality noted.  The kidneys enhance with no calculus or mass and no hydronephrosis is seen.  The abdominal aorta is tortuous with atheromatous change but no aneurysm is seen.  No adenopathy is noted.  IMPRESSION:  1.  No intra-abdominal abnormality.  There is a large amount  of feces throughout the colon.  2.  Tortuous  abdominal aorta with atheromatous change but no aneurysm.  Original Report Authenticated By: Juline Patch, M.D.   Ct Abdomen Pelvis W Contrast  10/20/2011  *RADIOLOGY REPORT*  Clinical Data: Abdominal pain, left upper quadrant pain.  CT ABDOMEN AND PELVIS WITH CONTRAST  Technique:  Multidetector CT imaging of the abdomen and pelvis was performed following the standard protocol during bolus administration of intravenous contrast.  Contrast: 80mL OMNIPAQUE IOHEXOL 300 MG/ML IJ SOLN  Comparison: CT abdomen 10/1978 14-1013 the  Findings: There is elevation of the left hemidiaphragm associated dense atelectasis at the left lung base with small effusion.  There are air bronchograms additionally.  Cannot exclude left lower lobe pneumonia.  There is motion degradation of the images.  There is asymmetric breast tissue with the right breast tissue later in volume compared to the left.  No pericardial fluid.  No focal hepatic lesion.  The gallbladder, pancreas, spleen, adrenal glands, and kidneys are normal.  No hydronephrosis.  The stomach, small bowel, and cecum are normal.  There moderate volume stool throughout the  colon.  There is a contrast within the rectosigmoid colon from prior CT exam of 10/14/2011.  There is a large stool ball within the rectum measuring 9.5 x 8.5 cm.  Abdominal aorta is normal caliber.  No retroperitoneal lymphadenopathy.  No free fluid the pelvis.  Post hysterectomy anatomy.  The bladder uterus are normal.  Ovaries not identified.  No pelvic lymphadenopathy.  Review of bone windows demonstrates internal fixation of the right hip.  Posterior rib fractures on the left at of the seventh and eighth ribs which are not present on prior.  IMPRESSION:  1.  New rib fractures at on the left of the seventh and eighth ribs posteriorly.  2.  Left lower lobe atelectasis and effusion versus infiltrate. 3.  Chronic elevation of the left hemidiaphragm. 4.   Constipation with large stool ball the rectum.  Recommend disimpaction.  5.  Asymmetric breast tissue.  These results will be called to the ordering clinician or representative by the Radiologist Assistant, and communication documented in the PACS Dashboard.  Original Report Authenticated By: Genevive Bi, M.D.   Dg Abd Acute W/chest  10/18/2011  *RADIOLOGY REPORT*  Clinical Data: Abdominal pain.  Confusion  ACUTE ABDOMEN SERIES (ABDOMEN 2 VIEW & CHEST 1 VIEW)  Comparison: 10/14/2011  Findings: There is marked asymmetric elevation of the left hemidiaphragm.  This is similar to previous exam.  The heart size is normal.  No pleural effusion or edema.  Pulmonary venous congestion is identified which appears similar to previous exam.  No airspace consolidation identified.  There is a marked amount of desiccated stool identified within the colon and rectum. Rectal impaction is suspected.  No dilated loops of small bowel or fluid levels identified.  IMPRESSION:  1.  No acute cardiopulmonary abnormalities. 2.  Suspect rectal impaction with constipation.  Original Report Authenticated By: Rosealee Albee, M.D.    Discharge Vitals & PE:  BP 153/71  Pulse 106  Temp(Src) 98.5 F (36.9 C) (Oral)  Resp 20  Ht 4\' 11"  (1.499 m)  Wt 51.211 kg (112 lb 14.4 oz)  BMI 22.80 kg/m2  SpO2 95% Alert, oriented female in no specific painful distress at rest however in significant pain on moving No pallor no icterus Chest clinically clear, no added sound Significant tenderness to left-sided chest under axilla and posteriorly. S1-S2 no murmur rub or gallop Fully oriented with no focal deficit  Discharge Labs: No results found for this or any previous visit (from the past 24 hour(s)).  Disposition and follow-up:   Beverly Adkins was discharged from in good condition.    Follow-up Appointments:   Discharge Medications: Medication List  As of 10/22/2011 11:12 AM   TAKE these medications         albuterol 108  (90 BASE) MCG/ACT inhaler   Commonly known as: PROVENTIL HFA;VENTOLIN HFA   Inhale 2 puffs into the lungs every 6 (six) hours as needed. Shortness of breath      aspirin 81 MG tablet   Take 81 mg by mouth daily.      calcium carbonate 500 MG chewable tablet   Commonly known as: TUMS - dosed in mg elemental calcium   Chew 1 tablet (200 mg of elemental calcium total) by mouth 2 (two) times daily with a meal.      cholecalciferol 1000 UNITS tablet   Commonly known as: VITAMIN D   Take 2 tablets (2,000 Units total) by mouth daily.      clindamycin 1 % gel  Commonly known as: CLINDAGEL   Apply topically 3 (three) times daily.      diclofenac sodium 1 % Gel   Commonly known as: VOLTAREN   Apply 1 application topically 4 (four) times daily.      famotidine 40 MG tablet   Commonly known as: PEPCID   Take 60 mg by mouth daily.      gabapentin 300 MG capsule   Commonly known as: NEURONTIN   Take 300-900 mg by mouth 3 (three) times daily. Takes 1 capsule in the morning, 2 capsules at noon, and 3 capsules in the evening.      ibuprofen 400 MG tablet   Commonly known as: ADVIL,MOTRIN   Take 1 tablet (400 mg total) by mouth every 6 (six) hours as needed for pain.      oxybutynin 5 MG tablet   Commonly known as: DITROPAN   Take 5 mg by mouth daily.      oxyCODONE-acetaminophen 5-325 MG per tablet   Commonly known as: PERCOCET   Take 1-2 tablets by mouth every 6 (six) hours.      senna-docusate 8.6-50 MG per tablet   Commonly known as: Senokot-S   Take 2 tablets by mouth 2 (two) times daily.      sorbitol 70 % Soln   Take 20 mLs by mouth 2 (two) times daily.      SYSTANE ULTRA 0.4-0.3 % Soln   Generic drug: Polyethyl Glycol-Propyl Glycol   Apply to eye 3 (three) times daily. One drop in both eyes      traMADol 50 MG tablet   Commonly known as: ULTRAM   Take 50 mg by mouth 2 (two) times daily as needed. pain           Medications Discontinued During This Encounter   Medication Reason  . Polyethyl Glycol-Propyl Glycol 0.4-0.3 % SOLN 1 drop Formulary change  . dextrose 5 %-0.9 % sodium chloride infusion   . ketorolac (TORADOL) 30 MG/ML injection 30 mg   . morphine 2 MG/ML injection 1 mg   . polyethylene glycol (MIRALAX / GLYCOLAX) packet 17 g   . senna-docusate (Senokot-S) tablet 2 tablet   . 0.9 %  sodium chloride infusion   . ergocalciferol (DRISDOL) 8000 UNIT/ML drops 2,000 Units Formulary change  . morphine 2 MG/ML injection 1 mg     Signed: Ainslie Mazurek,JAI 10/22/2011, 11:12 AM

## 2011-10-22 NOTE — Consult Note (Signed)
Reason for Consult:L1 compression fracture with osteoporosis and severe back pain Referring Physician: Kleo Dungee is an 78 y.o. female.  HPI: Beverly Adkins is an 78 y.o. female with history of anxiety, carotid stenosis, anxiety, elevated left hemidiaphragm, developed lower back pain for the past couple of days, started on Ultram, presents to [facility] emergency room because of altered mental status. Her daughter states that she has not been herself. Her daughter visits her daily. She has been confused and said that she was held in hostage. She also has severe constipation and abdominal pain, however this is not new. She does have chronic constipation. She denied chest pain, shortness of breath, nausea, vomiting, black stool, bloody stool, fever, chills, stiff neck, headache, or any visual problems. Workup in the emergency room included a negative head CT, normal white count, unremarkable urinalysis, unremarkable electrolytes, and a clear chest x-ray. She recently had abdominal pelvic CT, and it did show an L1 compression fracture of unclear duration. She also has elevated left hemidiaphragm, without any respiratory problems. Hospital was was asked to admit her for altered mental status and constipation.  On discussion with the patient and her daughter, they both say that her pain is from her ribs and her back is not hurting.  She points to her left lower chest as the source of pain.   Past Medical History  Diagnosis Date  . Elevated hemidiaphragm     Left  . Anxiety   . Abrasion of cornea, left   . Aphthous ulcer   . Carotid artery stenosis   . GERD (gastroesophageal reflux disease)   . Hyperkalemia   . Osteoarthritis   . Osteoporosis   . Peripheral neuropathy   . Incontinence of urine     urge  . Anal ulcer   . Fracture of femur, shaft     closed, Right    Past Surgical History  Procedure Date  . Cataract extraction   . Cesarean section   . Foot surgery   .  Hip surgery     right, Dr. Magnus Ivan  . Neuroplasty decompression median nerve at carpal tunnel     Family History  Problem Relation Age of Onset  . Alzheimer's disease    . Diabetes type II    . Hypertension    . Mental retardation    . Osteoporosis    . Parkinsonism    . Cancer    . Heart disease      Social History:  reports that she has never smoked. She has never used smokeless tobacco. She reports that she does not drink alcohol or use illicit drugs.  Allergies: No Known Allergies  Medications: Medications:  HOME MEDS:  Prior to Admission medications   Medication  Sig  Start Date  End Date  Taking?  Authorizing Provider   albuterol (PROVENTIL HFA;VENTOLIN HFA) 108 (90 BASE) MCG/ACT inhaler  Inhale 2 puffs into the lungs every 6 (six) hours as needed. Shortness of breath    Yes  Historical Provider, MD   aspirin 81 MG tablet  Take 81 mg by mouth daily.    Yes  Historical Provider, MD   clindamycin (CLINDAGEL) 1 % gel  Apply topically 3 (three) times daily.    Yes  Historical Provider, MD   diclofenac sodium (VOLTAREN) 1 % GEL  Apply 1 application topically 4 (four) times daily.    Yes  Historical Provider, MD   famotidine (PEPCID) 40 MG tablet  Take 60 mg  by mouth daily.    Yes  Historical Provider, MD   gabapentin (NEURONTIN) 300 MG capsule  Take 300-900 mg by mouth 3 (three) times daily. Takes 1 capsule in the morning, 2 capsules at noon, and 3 capsules in the evening.    Yes  Historical Provider, MD   oxybutynin (DITROPAN) 5 MG tablet  Take 5 mg by mouth daily.    Yes  Historical Provider, MD   Polyethyl Glycol-Propyl Glycol (SYSTANE ULTRA) 0.4-0.3 % SOLN  Apply to eye 3 (three) times daily. One drop in both eyes    Yes  Historical Provider, MD   traMADol (ULTRAM) 50 MG tablet  Take 50 mg by mouth 2 (two) times daily as needed. pain    Yes  Historical Provider, MD       No results found for this or any previous visit (from the past 48 hour(s)).  No results  found.  Review of Systems - The patient denies anorexia, fever, weight loss,, vision loss, decreased hearing, hoarseness, chest pain, syncope, dyspnea on exertion, peripheral edema, balance deficits, hemoptysis, abdominal pain, melena, hematochezia, severe indigestion/heartburn, hematuria, incontinence, genital sores, muscle weakness, suspicious skin lesions, transient blindness, difficulty walking, depression, unusual weight change, abnormal bleeding, enlarged lymph nodes, angioedema, and breast masses.       Blood pressure 153/71, pulse 106, temperature 98.5 F (36.9 C), temperature source Oral, resp. rate 20, height 4\' 11"  (1.499 m), weight 51.211 kg (112 lb 14.4 oz), SpO2 95.00%.  Pleasant older woman in no acute distress.  In bed, moves slowly secondary to rib pain.  No tenderness over her back.  Palpable deformity at L 1, non-tender. Denies leg pain or numbness.  Full strength both lower extremities in all motor groups.  Reflexes symmetric, toes down going.  No numbness in legs or perineum per patient.  Assessment/Plan: I suspect that the L 1 fracture is old and is not the source of her current pain. Her pain seems to be secondary to her rib fracture.  I offered for patient to follow up with me in the office in one month or to follow up only if she develops pain.  They said that since she does not hurt, they would rather only follow up with me if she becomes symptomatic.  I gave her my card and they know how to contact me if she develops back pain.  Dorian Heckle, MD 10/22/2011, 12:33 PM

## 2011-10-22 NOTE — Progress Notes (Signed)
Spokek with Dr. Venetia Maxon, of [facility]-patient might benefit from some form of intervention given increased pain.  Will defer management to him and will continue to follow-  Tried to update daughter at phone number, no answer.  Updated Rn and s/w Pleas Koch, MD [facility] [phone]

## 2011-10-23 NOTE — Progress Notes (Signed)
PROGRESS NOTE  Beverly Adkins ZOX:[id number] DOB: 03-27-1925 DOA: 10/18/2011 PCP: Fredderick Severance, MD, MD  Brief narrative: 78 year old female presented to hospital 3/18 with altered mental status.  Found to have compression fracture L1 and also rib fractures this admission  Past medical history: Anxiety, carotid stenosis, elevated left hemidiaphragm, recent fractures that are new without any inciting event (fall etc.)  Consultants:  None  Procedures:  CT chest 3/14 = elevated left hemidiaphragm, small hiatal hernia, marked compression deformity of L1 vertebral body with diffuse degenerative change  CT abdomen 3/14 = no intra-abdominal abnormality with large amount of feces throughout. Tortuous abdominal aorta with atheromatous change but no aneurysm  Ct Brain=No acute Abnormality  CT Abd and Pelvis 3/18=new rib #'s on the Left 7th/8th ribs posteriorly, L Lower lobe atelectasis, Chornis elevation of L hemidiaphragn, Constipation with large stool ball in the rectum  Antibiotics:  None   Subjective  Patient not d/c yesterday.  Pleasant.  Pain seems to be decreased compared to prior. Able to sit out of bed this am. Needed to move slowly today, but was able to get from bed-chair   Objective   Interim History: Chart reviewed in entirety.  Objective: Filed Vitals:   10/22/11 2245 10/23/11 0500 10/23/11 1021 10/23/11 1441  BP: 124/65 132/71 129/73 112/69  Pulse: 90 81 100 90  Temp: 98.6 F (37 C) 98.2 F (36.8 C)  98.2 F (36.8 C)  TempSrc: Oral Oral  Oral  Resp: 18 19  20   Height:      Weight:  51.2 kg (112 lb 14 oz)    SpO2: 93% 95%  94%    Intake/Output Summary (Last 24 hours) at 10/23/11 1747 Last data filed at 10/23/11 1300  Gross per 24 hour  Intake    630 ml  Output   1975 ml  Net  -1345 ml    Exam:   General: Alert and pleasant CF Cardiovascular:  s1 s2 no m/r/g. Respiratory: clinically clear.  No added sound.  Significant pain to the L lower ribs    Abdomen: soft, nt/nd Skin No lower abd pain  Neuro-Grossly intact, Moves all 4 limbs equally  Data Reviewed: Basic Metabolic Panel:  Lab 10/20/11 8119 10/19/11 0628 10/18/11 1543  NA 135 137 136  K 3.9 3.9 --  CL 99 100 97  CO2 26 27 26   GLUCOSE 103* 136* 98  BUN 10 13 19   CREATININE 0.59 0.59 0.62  CALCIUM 8.7 8.5 9.5  MG -- -- --  PHOS -- -- --   Liver Function Tests:  Lab 10/18/11 1543  AST 33  ALT 35  ALKPHOS 98  BILITOT 0.3  PROT 6.7  ALBUMIN 3.5    Lab 10/18/11 1543  LIPASE 24  AMYLASE --   No results found for this basename: AMMONIA:5 in the last 168 hours CBC:  Lab 10/20/11 0630 10/19/11 0628 10/18/11 1543  WBC 6.9 5.4 8.3  NEUTROABS -- -- 6.4  HGB 12.8 12.2 13.5  HCT 38.5 36.2 39.4  MCV 87.5 86.8 86.0  PLT 255 251 262   Cardiac Enzymes:  Lab 10/18/11 1544  CKTOTAL --  CKMB --  CKMBINDEX --  TROPONINI <0.30   BNP: No components found with this basename: POCBNP:5 CBG: No results found for this basename: GLUCAP:5 in the last 168 hours  No results found for this or any previous visit (from the past 240 hour(s)).   Studies:  All Imaging reviewed and is as per above notation   Scheduled Meds:    . amLODipine  10 mg Oral Daily  . aspirin  81 mg Oral Daily  . calcium carbonate  1 tablet Oral BID WC  . cholecalciferol  2,000 Units Oral Daily  . enoxaparin  40 mg Subcutaneous Q24H  . famotidine  60 mg Oral Daily  . oxyCODONE-acetaminophen  1 tablet Oral Q6H  . polyvinyl alcohol  1 drop Both Eyes TID  . senna-docusate  2 tablet Oral BID  . sorbitol  10 mL Oral BID   Continuous Infusions:     Assessment/Plan: 1. Confusion-Likely 2/2 to Tramadol, possibly due to Depot Steroid injections given last Friday into knee.  Have changed her to Percocet 5/325 every 6 when necessary 2. Rib fracture+Li compression #-no inciting event. High threshold for workup given totally normal CBC, normal coags. Will add LFTs to do more his labs  but likely this is all related to osteopenia which has been documented in the past and probably was not helped by her the Knee injection with steroids- Will have her sit up and out of bed. Patient will need a nursing facility, and we are searching for appropriate facility to transfer on Monday 3. Osteoporosis-will place on calcium carbonate and vitamin D, knowing fully well calcium may constipate her 4. Significant constipation-we'll attempt to minimize opiates as per above and other medications. I placed her on sorbitol. Manual disimpaction was done with poor result given she had a Fleet enema. Will double her dose of Senokot to help with expulsion.  She passed a stool this am and we can cut back her laxatives. 5. Left hemidiaphragm elevation-seen by Dr. Zenaida Niece tright in the past for possible surgery of this, secondary to shortness of breath and choking feeling when bending over especially after eating meals. for outpatient followup  Code Status: Full  Family Communication: Daughters in room-discussed with both of them POC Disposition Plan: SNF likely   Pleas Koch, MD  [facility] Pager [phone] 10/23/2011, 5:47 PM    LOS: 5 days

## 2011-10-24 ENCOUNTER — Observation Stay (HOSPITAL_COMMUNITY): Payer: Medicare Other

## 2011-10-24 LAB — URINALYSIS, ROUTINE W REFLEX MICROSCOPIC
Bilirubin Urine: NEGATIVE
Specific Gravity, Urine: 1.015 (ref 1.005–1.030)
pH: 6 (ref 5.0–8.0)

## 2011-10-24 LAB — URINE MICROSCOPIC-ADD ON

## 2011-10-24 MED ORDER — LEVOFLOXACIN IN D5W 500 MG/100ML IV SOLN
500.0000 mg | INTRAVENOUS | Status: DC
  Administered 2011-10-24 – 2011-10-26 (×3): 500 mg via INTRAVENOUS
  Filled 2011-10-24 (×3): qty 100

## 2011-10-24 NOTE — Progress Notes (Signed)
PROGRESS NOTE  Beverly Adkins AVW:[id number] DOB: Mar 06, 1925 DOA: 10/18/2011 PCP: Fredderick Severance, MD, MD  Brief narrative: 78 year old female presented to hospital 3/18 with altered mental status.  Found to have compression fracture L1 and also rib fractures this admission  Past medical history: Anxiety, carotid stenosis, elevated left hemidiaphragm, recent fractures that are new without any inciting event (fall etc.)  Consultants:  None  Procedures:  CT chest 3/14 = elevated left hemidiaphragm, small hiatal hernia, marked compression deformity of L1 vertebral body with diffuse degenerative change  CT abdomen 3/14 = no intra-abdominal abnormality with large amount of feces throughout. Tortuous abdominal aorta with atheromatous change but no aneurysm  Ct Brain=No acute Abnormality  CT Abd and Pelvis 3/18=new rib #'s on the Left 7th/8th ribs posteriorly, L Lower lobe atelectasis, Chornis elevation of L hemidiaphragn, Constipation with large stool ball in the rectum  Antibiotics:  None   Subjective  Developed fever of 102.6. Still has Foley catheter. No cough, no diarrhea, feels chilly. Not tolerating by mouth as well as prior Needed to move slowly today, but was able to get from bed-chair   Objective   Interim History: Chart reviewed in entirety.  Objective: Filed Vitals:   10/24/11 0500 10/24/11 1401 10/24/11 1415 10/24/11 1429  BP:  134/67    Pulse:  108    Temp:  102 F (38.9 C) 101.1 F (38.4 C) 102.6 F (39.2 C)  TempSrc:  Oral Oral Oral  Resp:  20    Height:      Weight: 49.9 kg (110 lb 0.2 oz)     SpO2:  94%      Intake/Output Summary (Last 24 hours) at 10/24/11 1520 Last data filed at 10/24/11 1502  Gross per 24 hour  Intake    345 ml  Output   2325 ml  Net  -1980 ml    Exam:   General: Alert and pleasant CF Cardiovascular:  s1 s2 no m/r/g. Respiratory: clinically clear.  No added sound, no tactile vocal fremitus and resonance.  Significant pain to  the L lower ribs  Abdomen: soft, nt/nd Skin No lower abd pain  Neuro-Grossly intact, Moves all 4 limbs equally  Data Reviewed: Basic Metabolic Panel:  Lab 10/20/11 8295 10/19/11 0628 10/18/11 1543  NA 135 137 136  K 3.9 3.9 --  CL 99 100 97  CO2 26 27 26   GLUCOSE 103* 136* 98  BUN 10 13 19   CREATININE 0.59 0.59 0.62  CALCIUM 8.7 8.5 9.5  MG -- -- --  PHOS -- -- --   Liver Function Tests:  Lab 10/18/11 1543  AST 33  ALT 35  ALKPHOS 98  BILITOT 0.3  PROT 6.7  ALBUMIN 3.5    Lab 10/18/11 1543  LIPASE 24  AMYLASE --   No results found for this basename: AMMONIA:5 in the last 168 hours CBC:  Lab 10/20/11 0630 10/19/11 0628 10/18/11 1543  WBC 6.9 5.4 8.3  NEUTROABS -- -- 6.4  HGB 12.8 12.2 13.5  HCT 38.5 36.2 39.4  MCV 87.5 86.8 86.0  PLT 255 251 262   Cardiac Enzymes:  Lab 10/18/11 1544  CKTOTAL --  CKMB --  CKMBINDEX --  TROPONINI <0.30   BNP: No components found with this basename: POCBNP:5 CBG: No results found for this basename: GLUCAP:5 in the last 168 hours  No results found for this or any previous visit (from the past 240 hour(s)).   Studies:  All Imaging reviewed and is as per above notation   Scheduled Meds:    . amLODipine  10 mg Oral Daily  . aspirin  81 mg Oral Daily  . calcium carbonate  1 tablet Oral BID WC  . cholecalciferol  2,000 Units Oral Daily  . enoxaparin  40 mg Subcutaneous Q24H  . famotidine  60 mg Oral Daily  . oxyCODONE-acetaminophen  1 tablet Oral Q6H  . polyvinyl alcohol  1 drop Both Eyes TID  . senna-docusate  2 tablet Oral BID  . DISCONTD: sorbitol  10 mL Oral BID   Continuous Infusions:     Assessment/Plan: 1. Fever? Source-get blood culture, urine culture, urinalysis, chest x-ray portable. Will review with findings of the same. Will start empiric levofloxacin and follow results to determine further antibiotic needs, likely pneumonia given O2 sats are above 94 and patient has no specific cough.  We'll discontinue Foley catheter 2. Confusion-Likely 2/2 to Tramadol, possibly due to Depot Steroid injections given last Friday into knee.  Have changed her to Percocet 5/325 every 6 when necessary 3. Rib fracture+Li compression #-no inciting event. High threshold for workup given totally normal CBC, normal coags.  this is all related to osteopenia which has been documented in the past and probably was not helped by her the Knee injection with steroids. 4. Osteoporosis-will place on calcium carbonate and vitamin D, knowing fully well calcium may constipate her 5. Significant constipation-we'll attempt to minimize opiates as per above and other medications. I placed her on sorbitol. Manual disimpaction was done with poor result given she had a Fleet enema. Will double her dose of Senokot to help with expulsion.  She passed a stool this 3/23 and we cut back her laxatives. 6. Left hemidiaphragm elevation-seen by Dr. Zenaida Niece tright in the past for possible surgery of this, secondary to shortness of breath and choking feeling when bending over especially after eating meals. for outpatient followup  Code Status: Full  Family Communication: Daughters in room-discussed with both of them POC Disposition Plan: SNF likely   Pleas Koch, MD  [facility] Pager [phone] 10/24/2011, 3:20 PM    LOS: 6 days

## 2011-10-24 NOTE — Progress Notes (Signed)
Temp 102.6 . MD Corey Harold in to see pt . Orders given.

## 2011-10-25 LAB — CBC
Hemoglobin: 12 g/dL (ref 12.0–15.0)
MCHC: 33.9 g/dL (ref 30.0–36.0)
Platelets: 282 10*3/uL (ref 150–400)
RBC: 4.09 MIL/uL (ref 3.87–5.11)

## 2011-10-25 NOTE — Progress Notes (Signed)
Pt with bed available at [facility]. Pt daughter advised. CSW reviewed chart and will continue to follow for d/c planning as pt progresses.  Baxter Flattery, MSW [phone]

## 2011-10-25 NOTE — Plan of Care (Signed)
Problem: Phase III Progression Outcomes Goal: Voiding independently Outcome: Completed/Met Date Met:  10/25/11 Incontinent but is able to get to the bathroom

## 2011-10-25 NOTE — Progress Notes (Signed)
Physical Therapy Treatment Patient Details Name: Beverly Adkins MRN: [id number] DOB: 02-02-1925 Today's Date: 10/25/2011  PT Assessment/Plan  PT - Assessment/Plan Comments on Treatment Session: Pt with improving mobility as pain improves.   PT Plan: Discharge plan remains appropriate Follow Up Recommendations: Skilled nursing facility Equipment Recommended: Defer to next venue PT Goals  Acute Rehab PT Goals Pt will Roll Supine to Left Side: with modified independence PT Goal: Rolling Supine to Left Side - Progress: Updated due to goal met Pt will go Supine/Side to Sit: with modified independence PT Goal: Supine/Side to Sit - Progress: Updated due to goal met Pt will go Sit to Stand: with modified independence PT Goal: Sit to Stand - Progress: Updated due to goal met Pt will go Stand to Sit: with modified independence PT Goal: Stand to Sit - Progress: Updated due to goals met PT Goal: Ambulate - Progress: Progressing toward goal  PT Treatment Precautions/Restrictions  Precautions Precautions: Fall Required Braces or Orthoses: No Restrictions Weight Bearing Restrictions: No Mobility (including Balance) Bed Mobility Bed Mobility: Yes Rolling Left: Other (comment);With rail;4: Min assist (min guard and extra time) Left Sidelying to Sit: 4: Min assist;With rails Sitting - Scoot to Edge of Bed: 5: Supervision;With rail;Other (comment) (with extra time) Transfers Sit to Stand: 4: Min assist;Other (comment);With upper extremity assist;From bed (min guard) Stand to Sit: 4: Min assist;To chair/3-in-1 (min guard) Stand to Sit Details: good control of descent, verbal cues for hand placement Ambulation/Gait Ambulation/Gait Assistance: 4: Min assist (min guard assist and incr time) Ambulation/Gait Assistance Details (indicate cue type and reason): Cues to widen base of support. Ambulation Distance (Feet): 20 Feet Assistive device: Rolling walker Gait Pattern: Decreased step length -  right;Decreased step length - left (narrow base of support)  Static Standing Balance Static Standing - Balance Support: Bilateral upper extremity supported (on walker) Static Standing - Level of Assistance: 5: Stand by assistance Exercise    End of Session PT - End of Session Activity Tolerance: Patient limited by fatigue Patient left: in chair;with call bell in reach;with family/visitor present Nurse Communication: Mobility status for transfers;Mobility status for ambulation General Behavior During Session: [facility] for tasks performed Cognition: [facility] for tasks performed  [facility] 10/25/2011, 2:33 PM  [facility] PT [phone]

## 2011-10-25 NOTE — Progress Notes (Signed)
PROGRESS NOTE  Beverly Adkins ZOX:[id number] DOB: Jun 24, 1925 DOA: 10/18/2011 PCP: Fredderick Severance, MD, MD  Brief narrative: 78 year old female presented to hospital 3/18 with altered mental status.  Found to have compression fracture L1 and also rib fractures this admission  Past medical history: Anxiety, carotid stenosis, elevated left hemidiaphragm, recent fractures that are new without any inciting event (fall etc.)  Consultants:  None  Procedures:  CT chest 3/14 = elevated left hemidiaphragm, small hiatal hernia, marked compression deformity of L1 vertebral body with diffuse degenerative change  CT abdomen 3/14 = no intra-abdominal abnormality with large amount of feces throughout. Tortuous abdominal aorta with atheromatous change but no aneurysm  Ct Brain=No acute Abnormality  CT Abd and Pelvis 3/18=new rib #'s on the Left 7th/8th ribs posteriorly, L Lower lobe atelectasis, Chornis elevation of L hemidiaphragn, Constipation with large stool ball in the rectum  Chest x-ray 3/24= streaky atelectasis VS bronchopneumonia at right lung base. Stable chronic elevation left hemidiaphragm chronic scar/atelectasis left lower lobe  Urine culture-pending  Blood culture x23/24 = pending, but negative so far  Antibiotics:  Levofloxacin 3/24 IV   Subjective  Doing better. Chest pain better. He would have a better. No further fever. No cough, no burning in the urine, no chills no rigor   Objective   Interim History: Chart reviewed in entirety.  Objective: Filed Vitals:   10/24/11 1415 10/24/11 1429 10/24/11 2147 10/25/11 0458  BP:   130/71 124/64  Pulse:   92 79  Temp: 101.1 F (38.4 C) 102.6 F (39.2 C) 98.6 F (37 C) 98.9 F (37.2 C)  TempSrc: Oral Oral Oral Oral  Resp:   20 18  Height:      Weight:    48.852 kg (107 lb 11.2 oz)  SpO2:   95% 95%    Intake/Output Summary (Last 24 hours) at 10/25/11 1413 Last data filed at 10/25/11 0500  Gross per 24 hour  Intake    175  ml  Output   1150 ml  Net   -975 ml    Exam:   General: Alert and pleasant CF Cardiovascular:  s1 s2 no m/r/g. Respiratory: clinically clear.  No added sound, no tactile vocal fremitus and resonance.  Significant pain to the L lower ribs  Abdomen: soft, nt/nd Skin No lower abd pain  Neuro-Grossly intact, Moves all 4 limbs equally  Data Reviewed: Basic Metabolic Panel:  Lab 10/25/11 8119 10/20/11 0630 10/19/11 0628 10/18/11 1543  NA -- 135 137 136  K -- 3.9 3.9 --  CL -- 99 100 97  CO2 -- 26 27 26   GLUCOSE -- 103* 136* 98  BUN -- 10 13 19   CREATININE 0.63 0.59 0.59 0.62  CALCIUM -- 8.7 8.5 9.5  MG -- -- -- --  PHOS -- -- -- --   Liver Function Tests:  Lab 10/18/11 1543  AST 33  ALT 35  ALKPHOS 98  BILITOT 0.3  PROT 6.7  ALBUMIN 3.5    Lab 10/18/11 1543  LIPASE 24  AMYLASE --   No results found for this basename: AMMONIA:5 in the last 168 hours CBC:  Lab 10/25/11 0935 10/20/11 0630 10/19/11 0628 10/18/11 1543  WBC 8.7 6.9 5.4 8.3  NEUTROABS -- -- -- 6.4  HGB 12.0 12.8 12.2 13.5  HCT 35.4* 38.5 36.2 39.4  MCV 86.6 87.5 86.8 86.0  PLT 282 255 251 262   Cardiac Enzymes:  Lab 10/18/11 1544  CKTOTAL --  CKMB --  CKMBINDEX --  TROPONINI <  0.30   BNP: No components found with this basename: POCBNP:5 CBG: No results found for this basename: GLUCAP:5 in the last 168 hours  Recent Results (from the past 240 hour(s))  CULTURE, BLOOD (ROUTINE X 2)     Status: Normal (Preliminary result)   Collection Time   10/24/11  3:40 PM      Component Value Range Status Comment   Specimen Description BLOOD RIGHT ARM   Final    Special Requests BOTTLES DRAWN AEROBIC ONLY 5CC   Final    Culture  Setup Time [id number]   Final    Culture     Final    Value:        BLOOD CULTURE RECEIVED NO GROWTH TO DATE CULTURE WILL BE HELD FOR 5 DAYS BEFORE ISSUING A FINAL NEGATIVE REPORT   Report Status PENDING   Incomplete   CULTURE, BLOOD (ROUTINE X 2)     Status: Normal  (Preliminary result)   Collection Time   10/24/11  3:43 PM      Component Value Range Status Comment   Specimen Description BLOOD RIGHT ARM   Final    Special Requests BOTTLES DRAWN AEROBIC ONLY 10CC   Final    Culture  Setup Time [id number]   Final    Culture     Final    Value:        BLOOD CULTURE RECEIVED NO GROWTH TO DATE CULTURE WILL BE HELD FOR 5 DAYS BEFORE ISSUING A FINAL NEGATIVE REPORT   Report Status PENDING   Incomplete      Studies:              All Imaging reviewed and is as per above notation   Scheduled Meds:    . amLODipine  10 mg Oral Daily  . aspirin  81 mg Oral Daily  . calcium carbonate  1 tablet Oral BID WC  . cholecalciferol  2,000 Units Oral Daily  . enoxaparin  40 mg Subcutaneous Q24H  . famotidine  60 mg Oral Daily  . levofloxacin (LEVAQUIN) IV  500 mg Intravenous Q24H  . oxyCODONE-acetaminophen  1 tablet Oral Q6H  . polyvinyl alcohol  1 drop Both Eyes TID  . senna-docusate  2 tablet Oral BID   Continuous Infusions:     Assessment/Plan: 1. Fever? Source-likely pneumonia, however as had urine catheter will await urine culture prior to discharge decision.  Will start empiric levofloxacin and follow results to determine further antibiotic needs, discontinue Foley catheter 2. Confusion-Likely 2/2 to Tramadol, possibly due to Depot Steroid injections given last Friday into knee.  Have changed her to Percocet 5/325 every 6 when necessary 3. Rib fracture+Li compression #-no inciting event. High threshold for workup given totally normal CBC, normal coags.  this is all related to osteopenia which has been documented in the past and probably was not helped by her the Knee injection with steroids. 4. Osteoporosis-will place on calcium carbonate and vitamin D, knowing fully well calcium may constipate her 5. Significant constipation-we'll attempt to minimize opiates as per above and other medications. I placed her on sorbitol. Manual disimpaction was done with  poor result given she had a Fleet enema. Will double her dose of Senokot to help with expulsion.  She passed a stool this 3/23 and we cut back her laxatives. 6. Left hemidiaphragm elevation-seen by Dr. Zenaida Niece tright in the past for possible surgery of this, secondary to shortness of breath and choking feeling when bending over especially after eating meals.  for outpatient followup  Code Status: Full  Family Communication: Daughters in room-discussed with daughter plan of care likely discharge tomorrow if we can get a preliminary report on urine culture. Both family and patient understood Disposition Plan: SNF likely-potential discharge 1-2 days   Pleas Koch, MD  [facility] Pager [phone] 10/25/2011, 2:13 PM    LOS: 7 days

## 2011-10-25 NOTE — Progress Notes (Signed)
Occupational Therapy Treatment Patient Details Name: Beverly Adkins MRN: [id number] DOB: 1925/01/08 Today's Date: 10/25/2011  OT Assessment/Plan OT Assessment/Plan Comments on Treatment Session: Pt progressing well with mobility now that pain has decreased. OT Plan: Discharge plan remains appropriate OT Frequency: Min 1X/week Follow Up Recommendations: Skilled nursing facility Equipment Recommended: Defer to next venue OT Goals Acute Rehab OT Goals OT Goal Formulation: With patient ADL Goals Pt Will Transfer to Toilet: with min assist;Ambulation;3-in-1 ADL Goal: Toilet Transfer - Progress: Progressing toward goals  OT Treatment Precautions/Restrictions  Precautions Precautions: Fall Required Braces or Orthoses: No Restrictions Weight Bearing Restrictions: No   ADL ADL Toilet Transfer: Simulated;Minimal assistance (min guard ) Toilet Transfer Details (indicate cue type and reason): to recliner with RW Toilet Transfer Method: Ambulating Equipment Used: Rolling walker Ambulation Related to ADLs: Min guard assist with RW, pt moves slowly, cues to separate feet Mobility  Bed Mobility Bed Mobility: Yes Rolling Left: Other (comment);With rail;4: Min assist (min guard and extra time) Left Sidelying to Sit: 4: Min assist;With rails Sitting - Scoot to Edge of Bed: 5: Supervision;With rail;Other (comment) (with extra time) Transfers Sit to Stand: 4: Min assist;Other (comment);With upper extremity assist;From bed (min guard) Stand to Sit: 4: Min assist;To chair/3-in-1 (min guard) Stand to Sit Details: good control of descent, verbal cues for hand placement End of Session OT - End of Session Activity Tolerance: Patient tolerated treatment well Patient left: in chair;with call bell in reach;with family/visitor present Nurse Communication: Mobility status for transfers;Mobility status for ambulation General Behavior During Session: [facility] for tasks performed Cognition: [facility] for tasks  performed  Evern Bio  10/25/2011, 1:38 PM

## 2011-10-26 MED ORDER — LEVOFLOXACIN 500 MG PO TABS
500.0000 mg | ORAL_TABLET | Freq: Every day | ORAL | Status: DC
  Filled 2011-10-26: qty 1

## 2011-10-26 NOTE — Progress Notes (Signed)
PROGRESS NOTE  Beverly Adkins ZOX:[id number] DOB: 04-21-1925 DOA: 10/18/2011 PCP: Fredderick Severance, MD, MD  Brief narrative: Please also see my prior dicatation summary 26/37 78 year old female presented to hospital 3/18 with altered mental status.  Found to have compression fracture L1 and also rib fractures this admission. Patient was kept in the hospital for pain control as her fractures were found to be present in the left seventh and eighth ribs and she was also found to have a marked compression deformity of L1 vertebral body with diffuse degenerative changes. Patient initially was kept on IV morphine and this was very difficult to wean given the patient's low pain threshold. Physical therapy and occupational therapy recommended nursing home placement and on the day of discharge patient's right fever 102.3.  It was thought that her pain in her chest might have caused her to splint and not take the respirations and patient was empirically kept on Levaquin IV. Urine culture returned today above 100,000 colony forming units of enterococcus and patient will be continued on an oral equivalent medication for another 2 days which would cover also a pneumonia of hospital stay. She will be seen in the morning by [facility] and likely can be discharged  Past medical history: Anxiety, carotid stenosis, elevated left hemidiaphragm, recent fractures that are new without any inciting event (fall etc.)  Consultants:  Neurosurgery, Dr. Venetia Maxon  Procedures:  CT chest 3/14 = elevated left hemidiaphragm, small hiatal hernia, marked compression deformity of L1 vertebral body with diffuse degenerative change  CT abdomen 3/14 = no intra-abdominal abnormality with large amount of feces throughout. Tortuous abdominal aorta with atheromatous change but no aneurysm  Ct Brain=No acute Abnormality  CT Abd and Pelvis 3/18=new rib #'s on the Left 7th/8th ribs posteriorly, L Lower lobe atelectasis, Chornis elevation of  L hemidiaphragn, Constipation with large stool ball in the rectum  Chest x-ray 3/24= streaky atelectasis VS bronchopneumonia at right lung base. Stable chronic elevation left hemidiaphragm chronic scar/atelectasis left lower lobe  Urine culture-pending  Blood culture x23/24 = pending, but negative so far  Antibiotics:  Levofloxacin 3/24 IV   Subjective  Doing better. Chest pain better.No further fever. No cough, no burning in the urine, no chills    Objective   Interim History: Chart reviewed in entirety.  Objective: Filed Vitals:   10/25/11 2115 10/26/11 0620 10/26/11 1221 10/26/11 1430  BP: 141/69 123/65 111/61 108/71  Pulse: 90 89  85  Temp: 99.2 F (37.3 C) 98.2 F (36.8 C)  98.7 F (37.1 C)  TempSrc: Oral Oral  Oral  Resp: 18 18  20   Height:      Weight:  48.8 kg (107 lb 9.4 oz)    SpO2: 94% 94%  94%    Intake/Output Summary (Last 24 hours) at 10/26/11 1635 Last data filed at 10/26/11 1500  Gross per 24 hour  Intake    480 ml  Output      2 ml  Net    478 ml    Exam:   General: Alert and pleasant CF Cardiovascular:  s1 s2 no m/r/g. Respiratory: clinically clear.  No added sound, no tactile vocal fremitus and resonance.  Significant pain to the L lower ribs  Abdomen: soft, nt/nd Skin No lower abd pain  Neuro-Grossly intact, Moves all 4 limbs equally  Data Reviewed: Basic Metabolic Panel:  Lab 10/25/11 8119 10/20/11 0630  NA -- 135  K -- 3.9  CL -- 99  CO2 -- 26  GLUCOSE --  103*  BUN -- 10  CREATININE 0.63 0.59  CALCIUM -- 8.7  MG -- --  PHOS -- --   Liver Function Tests: No results found for this basename: AST:5,ALT:5,ALKPHOS:5,BILITOT:5,PROT:5,ALBUMIN:5 in the last 168 hours No results found for this basename: LIPASE:5,AMYLASE:5 in the last 168 hours No results found for this basename: AMMONIA:5 in the last 168 hours CBC:  Lab 10/25/11 0935 10/20/11 0630  WBC 8.7 6.9  NEUTROABS -- --  HGB 12.0 12.8  HCT 35.4* 38.5  MCV 86.6 87.5    PLT 282 255   Cardiac Enzymes: No results found for this basename: CKTOTAL:5,CKMB:5,CKMBINDEX:5,TROPONINI:5 in the last 168 hours BNP: No components found with this basename: POCBNP:5 CBG: No results found for this basename: GLUCAP:5 in the last 168 hours  Recent Results (from the past 240 hour(s))  CULTURE, BLOOD (ROUTINE X 2)     Status: Normal (Preliminary result)   Collection Time   10/24/11  3:40 PM      Component Value Range Status Comment   Specimen Description BLOOD RIGHT ARM   Final    Special Requests BOTTLES DRAWN AEROBIC ONLY 5CC   Final    Culture  Setup Time [id number]   Final    Culture     Final    Value:        BLOOD CULTURE RECEIVED NO GROWTH TO DATE CULTURE WILL BE HELD FOR 5 DAYS BEFORE ISSUING A FINAL NEGATIVE REPORT   Report Status PENDING   Incomplete   CULTURE, BLOOD (ROUTINE X 2)     Status: Normal (Preliminary result)   Collection Time   10/24/11  3:43 PM      Component Value Range Status Comment   Specimen Description BLOOD RIGHT ARM   Final    Special Requests BOTTLES DRAWN AEROBIC ONLY 10CC   Final    Culture  Setup Time [id number]   Final    Culture     Final    Value:        BLOOD CULTURE RECEIVED NO GROWTH TO DATE CULTURE WILL BE HELD FOR 5 DAYS BEFORE ISSUING A FINAL NEGATIVE REPORT   Report Status PENDING   Incomplete   URINE CULTURE     Status: Normal (Preliminary result)   Collection Time   10/24/11  4:50 PM      Component Value Range Status Comment   Specimen Description URINE, CATHETERIZED   Final    Special Requests NONE   Final    Culture  Setup Time [id number]   Final    Colony Count >=100,000 COLONIES/ML   Final    Culture     Final    Value: ESCHERICHIA COLI     ENTEROCOCCUS SPECIES   Report Status PENDING   Incomplete    Organism ID, Bacteria ESCHERICHIA COLI   Final      Studies:              All Imaging reviewed and is as per above notation   Scheduled Meds:    . amLODipine  10 mg Oral Daily  . aspirin  81 mg Oral  Daily  . calcium carbonate  1 tablet Oral BID WC  . cholecalciferol  2,000 Units Oral Daily  . enoxaparin  40 mg Subcutaneous Q24H  . famotidine  60 mg Oral Daily  . levofloxacin (LEVAQUIN) IV  500 mg Intravenous Q24H  . oxyCODONE-acetaminophen  1 tablet Oral Q6H  . polyvinyl alcohol  1 drop Both Eyes TID  . senna-docusate  2 tablet Oral BID   Continuous Infusions:     Assessment/Plan: 1. Fever? Source-likely pneumonia=/-enterococcus UTI-change to po levaquin for 2 more days, discontinue Foley catheter 2. Confusion-Likely 2/2 to Tramadol, possibly due to Depot Steroid injections given last Friday into knee.  Have changed her to Percocet 5/325 every 6 when necessary-no significant pain at present. 3. Rib fracture+Li compression #-no inciting event. High threshold for workup given totally normal CBC, normal coags.  this is all related to osteopenia which has been documented in the past and probably was not helped by her the Knee injection with steroids. 4.   She was seen by Dr. Venetia Maxon of [facility] with view to alleviate pain but declined any specific intervention 5. Osteoporosis-will place on calcium carbonate and vitamin D, knowing fully well calcium may constipate her-needs Vit d level in 3 months 6. Significant constipation-we'll attempt to minimize opiates as per above and other medications. I placed her on sorbitol. Manual disimpaction was done with poor result.  She passed a stool this 3/23, and has been passing stool regularly-will need a  7. Left hemidiaphragm elevation-seen by Dr. Zenaida Niece tright in the past for possible surgery of this, secondary to shortness of breath and choking feeling when bending over especially after eating meals--for outpatient followup 8. Htn-stable on amlodipine 10mg   Code Status: Full  Family Communication: Daughters in room-discussed with daughter plan of care likely discharge tomorrowon PO abx Disposition Plan: SNF likely-potential discharge am    Pleas Koch,  MD  [facility] Pager [phone] 10/26/2011, 4:35 PM    LOS: 8 days

## 2011-10-26 NOTE — Progress Notes (Signed)
Occupational Therapy Treatment Patient Details Name: Beverly Adkins MRN: [id number] DOB: Nov 04, 1924 Today's Date: 10/26/2011  OT Assessment/Plan OT Assessment/Plan Comments on Treatment Session: Pt. progressing very well today and completed ADLs with minimal assistance.  OT Plan: Discharge plan remains appropriate OT Frequency: Min 1X/week Follow Up Recommendations: Skilled nursing facility Equipment Recommended: Defer to next venue OT Goals Acute Rehab OT Goals OT Goal Formulation: With patient Time For Goal Achievement: 2 weeks ADL Goals Pt Will Perform Grooming: with set-up;Standing at sink;Other (comment) ADL Goal: Grooming - Progress: Met Pt Will Transfer to Toilet: with min assist;Ambulation;3-in-1 ADL Goal: Toilet Transfer - Progress: Met  OT Treatment Precautions/Restrictions  Precautions Precautions: Fall Required Braces or Orthoses: No Restrictions Weight Bearing Restrictions: No   ADL ADL Grooming: Performed;Wash/dry face;Wash/dry hands;Teeth care;Set up;Supervision/safety Grooming Details (indicate cue type and reason): set up for brushing teeth Where Assessed - Grooming: Standing at sink Toilet Transfer: Performed;Minimal assistance Toilet Transfer Details (indicate cue type and reason): Mod verbal cues for hand placement and safety with RW use in bathroom Toilet Transfer Method: Ambulating Toilet Transfer Equipment: Raised toilet seat with arms (or 3-in-1 over toilet) Toileting - Clothing Manipulation: Performed;Minimal assistance Toileting - Clothing Manipulation Details (indicate cue type and reason): with pulling undergarments on rt. side Where Assessed - Toileting Clothing Manipulation: Sit to stand from 3-in-1 or toilet Toileting - Hygiene: Performed;Set up;Minimal assistance Toileting - Hygiene Details (indicate cue type and reason): Min assist for balance in standing and min verbal cues for hand placement on RW due to decreased balance Where Assessed -  Toileting Hygiene: Sit to stand from 3-in-1 or toilet Equipment Used: Rolling walker Ambulation Related to ADLs: Min guard assist with RW, pt moves slowly, cues to separate feet Mobility  Bed Mobility Bed Mobility: Yes Supine to Sit: 4: Min assist;With rails Transfers Transfers: Yes Sit to Stand: 4: Min assist;Other (comment);With upper extremity assist;From bed Sit to Stand Details (indicate cue type and reason): Min verbal cues for hand placement and technique Stand to Sit: 4: Min assist;To chair/3-in-1     End of Session OT - End of Session Equipment Utilized During Treatment: Gait belt Activity Tolerance: Patient tolerated treatment well Patient left: in chair;with call bell in reach;with family/visitor present Nurse Communication: Mobility status for transfers;Mobility status for ambulation General Behavior During Session: [facility] for tasks performed Cognition: [facility] for tasks performed  Cassandria Anger, OTR/L Pager [phone]  10/26/2011, 11:34 AM

## 2011-10-27 LAB — URINE CULTURE: Culture  Setup Time: 201303242101

## 2011-10-27 MED ORDER — SORBITOL 70 % SOLN: 20.0000 mL | Freq: Every day | Status: AC | PRN

## 2011-10-27 MED ORDER — AMLODIPINE BESYLATE 10 MG PO TABS: 10.0000 mg | ORAL_TABLET | Freq: Every day | ORAL | Status: DC

## 2011-10-27 MED ORDER — BISACODYL 10 MG RE SUPP: 10.0000 mg | Freq: Every day | RECTAL | Status: AC | PRN

## 2011-10-27 MED ORDER — LEVOFLOXACIN 250 MG PO TABS
250.0000 mg | ORAL_TABLET | Freq: Every day | ORAL | Status: DC
  Filled 2011-10-27: qty 1

## 2011-10-27 MED ORDER — OXYCODONE-ACETAMINOPHEN 5-325 MG PO TABS: 1.0000 | ORAL_TABLET | Freq: Four times a day (QID) | ORAL | Status: DC | PRN

## 2011-10-27 MED ORDER — LEVOFLOXACIN 250 MG PO TABS: 250.0000 mg | ORAL_TABLET | Freq: Every day | ORAL | Status: AC

## 2011-10-27 MED ORDER — OXYCODONE-ACETAMINOPHEN 5-325 MG PO TABS
1.0000 | ORAL_TABLET | Freq: Three times a day (TID) | ORAL | Status: DC
  Administered 2011-10-27: 1 via ORAL
  Filled 2011-10-27: qty 1

## 2011-10-27 MED ORDER — OXYCODONE-ACETAMINOPHEN 5-325 MG PO TABS: 1.0000 | ORAL_TABLET | Freq: Three times a day (TID) | ORAL | Status: AC

## 2011-10-27 MED ORDER — LEVOFLOXACIN 250 MG PO TABS: 250.0000 mg | ORAL_TABLET | Freq: Every day | ORAL | Status: DC

## 2011-10-27 NOTE — Progress Notes (Signed)
Clinical Social Worker had patient's advanced directive notarized at the request of family. A copy of patient's living will has been placed in the shadow chart.   Beverly Adkins MSW, [employer] [phone]

## 2011-10-27 NOTE — Discharge Summary (Signed)
Physician Discharge Summary  Patient ID: Beverly Adkins MRN: [id number] DOB/AGE: 1924-11-14 78 y.o.  Admit date: 10/18/2011 Discharge date: 10/27/2011  Primary Care Physician:  Fredderick Severance, MD, MD  Discharge Diagnoses:    Ivan Croft /enterococcus (sensitive to vancomycin), UTI Acute encephalopathy improved Osteopenia with rib fracture and L1 fracture  Left lower lobe atelectasis versus infiltrates  Constipation  .Elevated hemidiaphragm- follows Dr. Morton Peters, outpatient followup .Peripheral neuropathy .Carotid artery stenosis  Hypertension  Consults: Neurosurgery, Dr. Venetia Maxon   Discharge Medications: Medication List  As of 10/27/2011 11:46 AM   STOP taking these medications         gabapentin 300 MG capsule      traMADol 50 MG tablet         TAKE these medications         albuterol 108 (90 BASE) MCG/ACT inhaler   Commonly known as: PROVENTIL HFA;VENTOLIN HFA   Inhale 2 puffs into the lungs every 6 (six) hours as needed. Shortness of breath      aspirin 81 MG tablet   Take 81 mg by mouth daily.      bisacodyl 10 MG suppository   Commonly known as: DULCOLAX   Place 1 suppository (10 mg total) rectally daily as needed.      calcium carbonate 500 MG chewable tablet   Commonly known as: TUMS - dosed in mg elemental calcium   Chew 1 tablet (200 mg of elemental calcium total) by mouth 2 (two) times daily with a meal.      cholecalciferol 1000 UNITS tablet   Commonly known as: VITAMIN D   Take 2 tablets (2,000 Units total) by mouth daily.      clindamycin 1 % gel   Commonly known as: CLINDAGEL   Apply topically 3 (three) times daily.      diclofenac sodium 1 % Gel   Commonly known as: VOLTAREN   Apply 1 application topically 4 (four) times daily.      famotidine 40 MG tablet   Commonly known as: PEPCID   Take 60 mg by mouth daily.      ibuprofen 400 MG tablet   Commonly known as: ADVIL,MOTRIN   Take 1 tablet (400 mg total) by mouth every 6 (six) hours as needed for  pain.      levofloxacin 250 MG tablet   Commonly known as: LEVAQUIN   Take 1 tablet (250 mg total) by mouth daily.      oxybutynin 5 MG tablet   Commonly known as: DITROPAN   Take 5 mg by mouth daily.      oxyCODONE-acetaminophen 5-325 MG per tablet   Commonly known as: PERCOCET   Take 1 tablet by mouth every 8 (eight) hours.      senna-docusate 8.6-50 MG per tablet   Commonly known as: Senokot-S   Take 2 tablets by mouth 2 (two) times daily.      sorbitol 70 % Soln   Take 20 mLs by mouth daily as needed (CONSTIPATION).      SYSTANE ULTRA 0.4-0.3 % Soln   Generic drug: Polyethyl Glycol-Propyl Glycol   Apply to eye 3 (three) times daily. One drop in both eyes             Brief H and P: For complete details please refer to admission H and P, but in brief 78 year old female presented to [facility] on 10/18/2011 with altered mental status. Per the admission note, patient had not been herself and was confused. Patient also had  severe constipation and abdominal pain. Patient was recently started on ultram and was also noted to be on narcotics. Patient was admitted to hospitalist service for further evaluation. Patient was found to have compression fracture of L1 and also rib fractures.   Hospital Course:  1. Confusion-Likely 2/2 to Tramadol and fentanyl given, possibly due to Depot Steroid injections given prior to admission into knee, also found to have Escherichia coli and vancomycin sensitive enterococcus UTI. She  has completely resolved from a mental standpoint and there is no specific confusion. 2.  Escherichia coli and vancomycin sensitive enterococcus UTI: Patient had a CT abdomen and pelvis on 10/19/2011 which showed new rib fractures on the left of seventh and eighth ribs, left lower lobe atelectasis versus infiltrate. Urine culture was done and showed Escherichia coli and vancomycin sensitive enterococcus, sensitive to Levaquin. Patient will continue Levaquin for 7 days given  complicated UTI. 3. Rib fracture+L1 compression-no inciting event. Likely this is all related to osteopenia which has been documented in the past and probably was not helped by her the Knee injection with steroids, she experienced significant pain on moving around in the bed and had difficulty with transfers to the point that physical therapy/occupational therapy felt it would be safest for her to pursue his short-term rehabilitation. 4. Osteoporosis-will place on calcium carbonate and vitamin D, knowing fully well calcium may constipate her. She does have a strong history of osteoporosis in the past and I have placed her on 2000 mg of vitamin D. in addition to calcium carbonate regularly. She will need a followup vitamin D level in about 30-40 days to ensure that she is therapeutic.  5. Significant constipation-unfortunately her pain is poorly controlled on even minimal movement and she will require opiates. Patient has not experienced any confusion or any other issues at this point but will need close monitoring and possible increasing her bowel regimen. I placed her on sorbitol 70% 10 mL twice a day which have increased on discharge 20 mL twice a day.. Manual disimpaction was done with poor result given she had a Fleet enema and her stool was soft and not removable. Will double her dose of Senokot to help with expulsion. I would recommend adding fleet enemas in one to 2 days if she does not experience of stool. Her lack of mobility as well as her being on calcium may also contribute to her constipation. 6. Left hemidiaphragm elevation-seen by Dr. Morton Peters in the past for possible surgery of this, secondary to shortness of breath and choking feeling when bending over especially after eating meals. for outpatient followup-she would be an appropriate candidate for possible repair of this, but would recommend descalating her opiates prior to this procedure   Day of Discharge BP 124/62  Pulse 85  Temp(Src)  97.2 F (36.2 C) (Oral)  Resp 18  Ht 4\' 11"  (1.499 m)  Wt 48.8 kg (107 lb 9.4 oz)  BMI 21.73 kg/m2  SpO2 96%  Physical Exam: General: Alert and awake oriented x3 not in any acute distress. HEENT: anicteric sclera, pupils reactive to light and accommodation CVS: S1-S2 clear no murmur rubs or gallops Chest: clear to auscultation bilaterally, no wheezing rales or rhonchi Abdomen: soft nontender, nondistended, normal bowel sounds, no organomegaly Extremities: no cyanosis, clubbing or edema noted bilaterally Neuro: Cranial nerves II-XII intact, no focal neurological deficits   The results of significant diagnostics from this hospitalization (including imaging, microbiology, ancillary and laboratory) are listed below for reference.    LAB RESULTS:  Basic Metabolic Panel:  Lab 10/25/11 1610  NA --  K --  CL --  CO2 --  GLUCOSE --  BUN --  CREATININE 0.63  CALCIUM --  MG --  PHOS --    CBC:  Lab 10/25/11 0935  WBC 8.7  NEUTROABS --  HGB 12.0  HCT 35.4*  MCV 86.6  PLT 282   Significant Diagnostic Studies:  Ct Head Wo Contrast  10/18/2011  *RADIOLOGY REPORT*  Clinical Data: Altered mental status.  Confusion.  CT HEAD WITHOUT CONTRAST  Technique:  Contiguous axial images were obtained from the base of the skull through the vertex without contrast.  Comparison: None.  Findings: No intracranial hemorrhage.  No CT evidence of large acute infarct.  No intracranial mass lesion detected on this unenhanced exam.  Small vessel disease type changes.  Global atrophy without hydrocephalus.  Vascular calcifications.  Question minimal partial opacification inferior right mastoid air cells.  IMPRESSION: No acute abnormality.  Original Report Authenticated By: Fuller Canada, M.D.   Dg Abd Acute W/chest  10/18/2011  *RADIOLOGY REPORT*  Clinical Data: Abdominal pain.  Confusion  ACUTE ABDOMEN SERIES (ABDOMEN 2 VIEW & CHEST 1 VIEW)  Comparison: 10/14/2011  Findings: There is marked asymmetric  elevation of the left hemidiaphragm.  This is similar to previous exam.  The heart size is normal.  No pleural effusion or edema.  Pulmonary venous congestion is identified which appears similar to previous exam.  No airspace consolidation identified.  There is a marked amount of desiccated stool identified within the colon and rectum. Rectal impaction is suspected.  No dilated loops of small bowel or fluid levels identified.  IMPRESSION:  1.  No acute cardiopulmonary abnormalities. 2.  Suspect rectal impaction with constipation.  Original Report Authenticated By: Rosealee Albee, M.D.     Disposition and Follow-up: Discharge Orders    Future Orders Please Complete By Expires   Diet - low sodium heart healthy      Increase activity slowly          DISPOSITION:  skilled nursing facility  DIET:  heart healthy  ACTIVITY:  as tolerated     DISCHARGE FOLLOW-UP Follow-up Information    Follow up with Fredderick Severance, MD. Schedule an appointment as soon as possible for a visit in 10 days. [facility] FOLLOW-UP)          Time spent on Discharge: 45 minutes  Signed:  Conrad Zajkowski M.D. [facility] 10/27/2011, 11:46 AM

## 2011-10-27 NOTE — Progress Notes (Signed)
Physical Therapy Treatment Patient Details Name: Mada Sadik MRN: [id number] DOB: [birth year] Today's Date: 10/27/2011  PT Assessment/Plan  PT - Assessment/Plan Comments on Treatment Session: Pt continues to make good progress. PT Plan: Discharge plan remains appropriate Follow Up Recommendations: Skilled nursing facility Equipment Recommended: Defer to next venue PT Goals  Acute Rehab PT Goals PT Goal: Sit to Stand - Progress: Progressing toward goal PT Goal: Stand to Sit - Progress: Progressing toward goal PT Goal: Ambulate - Progress: Progressing toward goal  PT Treatment Precautions/Restrictions  Precautions Precautions: Fall Required Braces or Orthoses: No Restrictions Weight Bearing Restrictions: No Mobility (including Balance) Transfers Sit to Stand: 4: Min assist;From bed;From toilet;With upper extremity assist;With armrests (min guard assist) Stand to Sit: 4: Min assist;With upper extremity assist;With armrests;To toilet;To chair/3-in-1 (min guard assist) Ambulation/Gait Ambulation/Gait Assistance: 4: Min assist (min guard assist) Ambulation/Gait Assistance Details (indicate cue type and reason): Cues to widen base of support Ambulation Distance (Feet): 125 Feet Assistive device: Rolling walker Gait Pattern: Trunk flexed;Decreased step length - right;Decreased step length - left (narrow base of support)    Exercise    End of Session PT - End of Session Activity Tolerance: Patient tolerated treatment well Patient left: in chair;with call bell in reach Nurse Communication: Mobility status for ambulation General Behavior During Session: [facility] for tasks performed Cognition: [facility] for tasks performed  [facility] 10/27/2011, 12:32 PM  [facility] [phone]

## 2011-10-27 NOTE — Progress Notes (Signed)
Clinical Social Work Department BRIEF PSYCHOSOCIAL ASSESSMENT 10/27/2011  Patient:  Beverly Adkins, Beverly Adkins     Account Number:  [id number]     Admit date:  10/18/2011  Clinical Social Worker:  Hulan Fray  Date/Time:  10/27/2011 10:22 AM  Referred by:  RN  Date Referred:  10/21/2011 Referred for  SNF Placement   Other Referral:   Interview type:  Family Other interview type:    PSYCHOSOCIAL DATA Living Status:  ALONE Admitted from facility:   Level of care:   Primary support name:  Paula Compton Primary support relationship to patient:  CHILD, ADULT Degree of support available:   Supportive    CURRENT CONCERNS Current Concerns  Post-Acute Placement   Other Concerns:    SOCIAL WORK ASSESSMENT / PLAN Clinical Social Worker Cora P. received referral for SNF placement. CSW spoke with family and they preferred [facility] or [facility]. CSW initiated bed search on 10/21/11 and [facility] made a bed offer. Family agreeable to [facility] and plan is to discharge to facility by EMS when medically stable.   Assessment/plan status:  Psychosocial Support/Ongoing Assessment of Needs Other assessment/ plan:   Information/referral to community resources:    PATIENT'S/FAMILY'S RESPONSE TO PLAN OF CARE: Family agreeable to SNF placement of [facility].

## 2011-10-27 NOTE — Progress Notes (Signed)
   CARE MANAGEMENT NOTE 10/27/2011  Patient:  Beverly Adkins, Beverly Adkins   Account Number:  [id number]  Date Initiated:  10/27/2011  Documentation initiated by:  Onnie Boer  Subjective/Objective Assessment:   PT WAS ADMITTED WITH AMS     Action/Plan:   PROGRESSION OF CARE AND DISCHARGE PLANNING   Anticipated DC Date:  10/27/2011   Anticipated DC Plan:  SKILLED NURSING FACILITY  In-house referral  Clinical Social Worker      DC Planning Services  CM consult      Choice offered to / List presented to:             Status of service:  In process, will continue to follow Medicare Important Message given?   (If response is "NO", the following Medicare IM given date fields will be blank) Date Medicare IM given:   Date Additional Medicare IM given:    Discharge Disposition:  SKILLED NURSING FACILITY  Per UR Regulation:    If discussed at Long Length of Stay Meetings, dates discussed:    Comments:  ZOX:WRUEAV, Beverly Adkins  10/27/11 Onnie Boer, RN, BSN 1437 PT IS TO DC TO [name] TODAY.

## 2011-10-27 NOTE — Progress Notes (Addendum)
Clinical Social Work Department CLINICAL SOCIAL WORK PLACEMENT NOTE 10/27/2011  Patient:  Beverly Adkins, Beverly Adkins  Account Number:  [id number] Admit date:  10/18/2011  Clinical Social Worker:  Hulan Fray  Date/time:  10/27/2011 10:30 AM  Clinical Social Work is seeking post-discharge placement for this patient at the following level of care:   SKILLED NURSING   (*CSW will update this form in Epic as items are completed)   10/21/2011  Patient/family provided with [facility] Department of Clinical Social Work's list of facilities offering this level of care within the geographic area requested by the patient (or if unable, by the patient's family).  10/21/2011  Patient/family informed of their freedom to choose among providers that offer the needed level of care, that participate in Medicare, Medicaid or managed care program needed by the patient, have an available bed and are willing to accept the patient.    Patient/family informed of [facility]' ownership interest in [facility], as well as of the fact that they are under no obligation to receive care at this facility.  PASARR submitted to EDS on 08/08/2010 PASARR number received from EDS on 08/08/2010  FL2 transmitted to all facilities in geographic area requested by pt/family on  10/21/2011 FL2 transmitted to all facilities within larger geographic area on   Patient informed that his/her managed care company has contracts with or will negotiate with  certain facilities, including the following:     Patient/family informed of bed offers received:  10/22/2011 Patient chooses bed at [facility] Physician recommends and patient chooses bed at    Patient to be transferred to [facility] on  10/27/11 Patient to be transferred to facility by EMS  The following physician request were entered in Epic:   Additional Comments:

## 2011-10-27 NOTE — Progress Notes (Signed)
Clinical Social Worker facilitated discharge by contacting family and facility, Beverly Adkins. Patient's daughter is on her way to complete paperwork at the facility and once that is complete, then patient will be able to be transported via EMS. Patient's discharge packet, with DNR form inside, EMS form, and facesheet will be placed in [place]. CSW will sign off.   Rozetta Nunnery MSW, [employer] [phone]

## 2011-10-30 LAB — CULTURE, BLOOD (ROUTINE X 2)
Culture  Setup Time: 201303242101
Culture: NO GROWTH

## 2011-12-06 ENCOUNTER — Other Ambulatory Visit: Payer: Self-pay | Admitting: *Deleted

## 2011-12-06 DIAGNOSIS — R0602 Shortness of breath: Secondary | ICD-10-CM

## 2011-12-13 ENCOUNTER — Ambulatory Visit (HOSPITAL_COMMUNITY)
Admission: RE | Admit: 2011-12-13 | Discharge: 2011-12-13 | Disposition: A | Discharge status: Home / Self Care | Payer: Medicare Other | Source: Ambulatory Visit | Attending: Cardiothoracic Surgery | Admitting: Cardiothoracic Surgery

## 2011-12-13 DIAGNOSIS — R0602 Shortness of breath: Secondary | ICD-10-CM | POA: Insufficient documentation

## 2011-12-13 MED ORDER — ALBUTEROL SULFATE (5 MG/ML) 0.5% IN NEBU
2.5000 mg | INHALATION_SOLUTION | Freq: Once | RESPIRATORY_TRACT | Status: AC
  Administered 2011-12-13: 2.5 mg via RESPIRATORY_TRACT

## 2011-12-22 ENCOUNTER — Telehealth: Payer: Self-pay | Admitting: *Deleted

## 2011-12-22 NOTE — Telephone Encounter (Signed)
Mrs. Fonner daughter called to say that all of Mrs. Garczynski previous symptoms have resolved and she sees no need to come and see Dr. Morton Peters again.  She does not wish to have surgery.  I will give Dr. Donata Clay this message.

## 2011-12-31 ENCOUNTER — Ambulatory Visit: Payer: Medicare Other | Admitting: Cardiothoracic Surgery

## 2013-10-19 ENCOUNTER — Encounter (HOSPITAL_BASED_OUTPATIENT_CLINIC_OR_DEPARTMENT_OTHER): Payer: Self-pay | Admitting: Emergency Medicine

## 2013-10-19 ENCOUNTER — Emergency Department (HOSPITAL_BASED_OUTPATIENT_CLINIC_OR_DEPARTMENT_OTHER): Payer: Medicare Other

## 2013-10-19 ENCOUNTER — Emergency Department (HOSPITAL_BASED_OUTPATIENT_CLINIC_OR_DEPARTMENT_OTHER)
Admission: EM | Admit: 2013-10-19 | Discharge: 2013-10-20 | Disposition: A | Discharge status: Other Acute Inpatient Hospital | Payer: Medicare Other | Attending: Emergency Medicine | Admitting: Emergency Medicine

## 2013-10-19 DIAGNOSIS — Z79899 Other long term (current) drug therapy: Secondary | ICD-10-CM | POA: Insufficient documentation

## 2013-10-19 DIAGNOSIS — Z8781 Personal history of (healed) traumatic fracture: Secondary | ICD-10-CM | POA: Insufficient documentation

## 2013-10-19 DIAGNOSIS — F29 Unspecified psychosis not due to a substance or known physiological condition: Secondary | ICD-10-CM | POA: Insufficient documentation

## 2013-10-19 DIAGNOSIS — R4182 Altered mental status, unspecified: Secondary | ICD-10-CM

## 2013-10-19 DIAGNOSIS — F411 Generalized anxiety disorder: Secondary | ICD-10-CM | POA: Insufficient documentation

## 2013-10-19 DIAGNOSIS — M1990 Unspecified osteoarthritis, unspecified site: Secondary | ICD-10-CM | POA: Insufficient documentation

## 2013-10-19 DIAGNOSIS — E871 Hypo-osmolality and hyponatremia: Secondary | ICD-10-CM | POA: Insufficient documentation

## 2013-10-19 DIAGNOSIS — Z8679 Personal history of other diseases of the circulatory system: Secondary | ICD-10-CM | POA: Insufficient documentation

## 2013-10-19 DIAGNOSIS — Z7982 Long term (current) use of aspirin: Secondary | ICD-10-CM | POA: Insufficient documentation

## 2013-10-19 DIAGNOSIS — R05 Cough: Secondary | ICD-10-CM | POA: Insufficient documentation

## 2013-10-19 DIAGNOSIS — Z792 Long term (current) use of antibiotics: Secondary | ICD-10-CM | POA: Insufficient documentation

## 2013-10-19 DIAGNOSIS — Z8709 Personal history of other diseases of the respiratory system: Secondary | ICD-10-CM | POA: Insufficient documentation

## 2013-10-19 DIAGNOSIS — R059 Cough, unspecified: Secondary | ICD-10-CM | POA: Insufficient documentation

## 2013-10-19 DIAGNOSIS — Z791 Long term (current) use of non-steroidal anti-inflammatories (NSAID): Secondary | ICD-10-CM | POA: Insufficient documentation

## 2013-10-19 DIAGNOSIS — K219 Gastro-esophageal reflux disease without esophagitis: Secondary | ICD-10-CM | POA: Insufficient documentation

## 2013-10-19 DIAGNOSIS — Z8669 Personal history of other diseases of the nervous system and sense organs: Secondary | ICD-10-CM | POA: Insufficient documentation

## 2013-10-19 LAB — CBC WITH DIFFERENTIAL/PLATELET
BASOS ABS: 0 10*3/uL (ref 0.0–0.1)
Basophils Relative: 0 % (ref 0–1)
Eosinophils Absolute: 0 10*3/uL (ref 0.0–0.7)
Eosinophils Relative: 0 % (ref 0–5)
HEMATOCRIT: 40.5 % (ref 36.0–46.0)
Hemoglobin: 13.7 g/dL (ref 12.0–15.0)
LYMPHS PCT: 16 % (ref 12–46)
Lymphs Abs: 0.4 10*3/uL — ABNORMAL LOW (ref 0.7–4.0)
MCH: 30.4 pg (ref 26.0–34.0)
MCHC: 33.8 g/dL (ref 30.0–36.0)
MCV: 90 fL (ref 78.0–100.0)
Monocytes Absolute: 0.4 10*3/uL (ref 0.1–1.0)
Monocytes Relative: 14 % — ABNORMAL HIGH (ref 3–12)
NEUTROS ABS: 1.9 10*3/uL (ref 1.7–7.7)
Neutrophils Relative %: 71 % (ref 43–77)
PLATELETS: 155 10*3/uL (ref 150–400)
RBC: 4.5 MIL/uL (ref 3.87–5.11)
RDW: 13.8 % (ref 11.5–15.5)
WBC: 2.7 10*3/uL — AB (ref 4.0–10.5)

## 2013-10-19 LAB — COMPREHENSIVE METABOLIC PANEL
ALK PHOS: 60 U/L (ref 39–117)
ALT: 31 U/L (ref 0–35)
AST: 41 U/L — ABNORMAL HIGH (ref 0–37)
Albumin: 3.6 g/dL (ref 3.5–5.2)
BUN: 24 mg/dL — AB (ref 6–23)
CO2: 26 mEq/L (ref 19–32)
CREATININE: 0.7 mg/dL (ref 0.50–1.10)
Calcium: 7.9 mg/dL — ABNORMAL LOW (ref 8.4–10.5)
Chloride: 93 mEq/L — ABNORMAL LOW (ref 96–112)
GFR calc non Af Amer: 76 mL/min — ABNORMAL LOW (ref >90)
GFR, EST AFRICAN AMERICAN: 88 mL/min — AB (ref >90)
GLUCOSE: 170 mg/dL — AB (ref 70–99)
POTASSIUM: 3.8 meq/L (ref 3.7–5.3)
Sodium: 133 mEq/L — ABNORMAL LOW (ref 137–147)
TOTAL PROTEIN: 6.6 g/dL (ref 6.0–8.3)

## 2013-10-19 LAB — URINALYSIS, ROUTINE W REFLEX MICROSCOPIC
BILIRUBIN URINE: NEGATIVE
Glucose, UA: 100 mg/dL — AB
KETONES UR: 15 mg/dL — AB
Leukocytes, UA: NEGATIVE
Nitrite: NEGATIVE
Protein, ur: 30 mg/dL — AB
Specific Gravity, Urine: 1.019 (ref 1.005–1.030)
UROBILINOGEN UA: 0.2 mg/dL (ref 0.0–1.0)
pH: 5.5 (ref 5.0–8.0)

## 2013-10-19 LAB — URINE MICROSCOPIC-ADD ON

## 2013-10-19 LAB — TROPONIN I: Troponin I: <0.3 ng/mL (ref <0.30)

## 2013-10-19 MED ORDER — DEXTROSE 5 % IV SOLN
500.0000 mg | Freq: Once | INTRAVENOUS | Status: AC
  Administered 2013-10-19: 500 mg via INTRAVENOUS

## 2013-10-19 MED ORDER — SODIUM CHLORIDE 0.9 % IV BOLUS (SEPSIS)
500.0000 mL | Freq: Once | INTRAVENOUS | Status: AC
  Administered 2013-10-19: 500 mL via INTRAVENOUS

## 2013-10-19 MED ORDER — CEFTRIAXONE SODIUM 1 G IJ SOLR
INTRAMUSCULAR | Status: AC
  Filled 2013-10-19: qty 10

## 2013-10-19 MED ORDER — CEFTRIAXONE SODIUM 1 G IJ SOLR
1.0000 g | INTRAMUSCULAR | Status: DC
  Administered 2013-10-19: 1 g via INTRAVENOUS

## 2013-10-19 NOTE — ED Provider Notes (Signed)
CSN: [id number]     Arrival date & time 10/19/13  1713 History   First MD Initiated Contact with Patient 10/19/13 1714     Chief Complaint  Patient presents with  . Cough     (Consider location/radiation/quality/duration/timing/severity/associated sxs/prior Treatment) HPI Comments: Pt states that she has had a cough for the last 3 days and today she feels weak. Denies fever. Daughter states that the pt is confused today in that she was told to get ready to go and she still dressed in her pajamas. Pt also has said different things about when she ate last. Pt lives alone.  The history is provided by the patient and a relative.    Past Medical History  Diagnosis Date  . Elevated hemidiaphragm     Left  . Anxiety   . Abrasion of cornea, left   . Aphthous ulcer   . Carotid artery stenosis   . GERD (gastroesophageal reflux disease)   . Hyperkalemia   . Osteoarthritis   . Osteoporosis   . Peripheral neuropathy   . Incontinence of urine     urge  . Anal ulcer   . Fracture of femur, shaft     closed, Right   Past Surgical History  Procedure Laterality Date  . Cataract extraction    . Cesarean section    . Foot surgery    . Hip surgery      right, Dr. Ninfa Linden  . Neuroplasty decompression median nerve at carpal tunnel     Family History  Problem Relation Age of Onset  . Alzheimer's disease    . Diabetes type II    . Hypertension    . Mental retardation    . Osteoporosis    . Parkinsonism    . Cancer    . Heart disease     History  Substance Use Topics  . Smoking status: Never Smoker   . Smokeless tobacco: Never Used  . Alcohol Use: No   OB History   Grav Para Term Preterm Abortions TAB SAB Ect Mult Living                 Review of Systems  Respiratory: Negative.   Cardiovascular: Negative.   Neurological: Positive for weakness.      Allergies  Review of patient's allergies indicates no known allergies.  Home Medications   Current Outpatient Rx   Name  Route  Sig  Dispense  Refill  . denosumab (PROLIA) 60 MG/ML SOLN injection   Subcutaneous   Inject 60 mg into the skin every 6 (six) months. Administer in upper arm, thigh, or abdomen         . Furosemide (LASIX PO)   Oral   Take by mouth.         Marland Kitchen GABAPENTIN PO   Oral   Take by mouth.         . OMEPRAZOLE PO   Oral   Take by mouth.         Marland Kitchen albuterol (PROVENTIL HFA;VENTOLIN HFA) 108 (90 BASE) MCG/ACT inhaler   Inhalation   Inhale 2 puffs into the lungs every 6 (six) hours as needed. Shortness of breath         . aspirin 81 MG tablet   Oral   Take 81 mg by mouth daily.         . clindamycin (CLINDAGEL) 1 % gel   Topical   Apply topically 3 (three) times daily.         Marland Kitchen  diclofenac sodium (VOLTAREN) 1 % GEL   Topical   Apply 1 application topically 4 (four) times daily.         . famotidine (PEPCID) 40 MG tablet   Oral   Take 60 mg by mouth daily.          Marland Kitchen oxybutynin (DITROPAN) 5 MG tablet   Oral   Take 5 mg by mouth daily.          Vladimir Faster Glycol-Propyl Glycol (SYSTANE ULTRA) 0.4-0.3 % SOLN   Ophthalmic   Apply to eye 3 (three) times daily. One drop in both eyes         . sorbitol 70 % SOLN   Oral   Take 20 mLs by mouth daily as needed (CONSTIPATION).   500 mL   1    BP 127/76  Pulse 88  Temp(Src) 98.1 F (36.7 C) (Oral)  Resp 20  Ht 4\' 10"  (1.473 m)  Wt 105 lb (47.628 kg)  BMI 21.95 kg/m2  SpO2 99% Physical Exam  Nursing note and vitals reviewed. Constitutional: She is oriented to person, place, and time. She appears well-developed and well-nourished.  HENT:  Head: Normocephalic.  Right Ear: External ear normal.  Left Ear: External ear normal.  Eyes: Conjunctivae and EOM are normal. Pupils are equal, round, and reactive to light.  Neck: Normal range of motion. Neck supple.  Cardiovascular: Normal rate and regular rhythm.   Pulmonary/Chest: Effort normal and breath sounds normal.  Abdominal: Soft. Bowel  sounds are normal. There is no tenderness.  Musculoskeletal:  Bilateral lower extremity edema  Neurological: She is alert and oriented to person, place, and time.  No appreciable consistent deficits  Skin: Skin is warm and dry.  Psychiatric: She has a normal mood and affect.    ED Course  Procedures (including critical care time) Labs Review Labs Reviewed  COMPREHENSIVE METABOLIC PANEL - Abnormal; Notable for the following:    Sodium 133 (*)    Chloride 93 (*)    Glucose, Bld 170 (*)    BUN 24 (*)    Calcium 7.9 (*)    AST 41 (*)    Total Bilirubin <0.2 (*)    GFR calc non Af Amer 76 (*)    GFR calc Af Amer 88 (*)    All other components within normal limits  CBC WITH DIFFERENTIAL - Abnormal; Notable for the following:    WBC 2.7 (*)    Lymphs Abs 0.4 (*)    Monocytes Relative 14 (*)    All other components within normal limits  URINALYSIS, ROUTINE W REFLEX MICROSCOPIC - Abnormal; Notable for the following:    Glucose, UA 100 (*)    Hgb urine dipstick TRACE (*)    Ketones, ur 15 (*)    Protein, ur 30 (*)    All other components within normal limits  TROPONIN I  URINE MICROSCOPIC-ADD ON   Imaging Review No results found.   EKG Interpretation   Date/Time:  Friday October 19 2013 17:47:26 EDT Ventricular Rate:  91 PR Interval:  140 QRS Duration: 84 QT Interval:  362 QTC Calculation: 445 R Axis:   64 Text Interpretation:  Normal sinus rhythm Normal ECG No significant change  since last tracing Confirmed by HARRISON  MD, [place] ([id number]) on 10/19/2013  6:05:11 PM      MDM   Final diagnoses:  Altered mental status  Cough  Hyponatremia    Pt is to go to [facility] for [facility]. Treated  pt clinically for pneumonia. Based on weakness and the fact that pt is altered think pt need to be admitted    Glendell Docker, NP 10/19/13 2234

## 2013-10-19 NOTE — ED Notes (Signed)
Pt c/o Cough "forever"-daughter states x 2-3 days-confused today per daughter

## 2013-10-19 NOTE — ED Notes (Signed)
Pt daughter sts pt is at mental baseline except for not communicating what is wrong and being very thirsty. daughter sts pt has h/o similar problem when dehydrated.

## 2013-10-20 NOTE — ED Provider Notes (Signed)
Medical screening examination/treatment/procedure(s) were conducted as a shared visit with non-physician practitioner(s) and myself.  I personally evaluated the patient during the encounter.   EKG Interpretation   Date/Time:  Friday October 19 2013 17:47:26 EDT Ventricular Rate:  91 PR Interval:  140 QRS Duration: 84 QT Interval:  362 QTC Calculation: 445 R Axis:   64 Text Interpretation:  Normal sinus rhythm Normal ECG No significant change  since last tracing Confirmed by Macil Crady  MD, Sydna Brodowski ([id number]) on 10/19/2013  6:05:11 PM      I interviewed and examined the patient. Lungs are CTAB. Cardiac exam wnl. Abdomen soft. Although no pna on CXR, pt is having confusion at home w/ new onset productive cough. Will tx for clinical pna and admit to [facility].   Blanchard Kelch, MD 10/20/13 0010

## 2017-07-27 ENCOUNTER — Ambulatory Visit: Payer: Medicare Other | Admitting: Family Medicine

## 2018-12-04 ENCOUNTER — Encounter (HOSPITAL_BASED_OUTPATIENT_CLINIC_OR_DEPARTMENT_OTHER): Payer: Self-pay

## 2018-12-04 ENCOUNTER — Emergency Department (HOSPITAL_BASED_OUTPATIENT_CLINIC_OR_DEPARTMENT_OTHER): Payer: Medicare Other

## 2018-12-04 ENCOUNTER — Other Ambulatory Visit: Payer: Self-pay

## 2018-12-04 ENCOUNTER — Emergency Department (HOSPITAL_BASED_OUTPATIENT_CLINIC_OR_DEPARTMENT_OTHER)
Admission: EM | Admit: 2018-12-04 | Discharge: 2018-12-04 | Disposition: A | Discharge status: Home / Self Care | Payer: Medicare Other | Attending: Emergency Medicine | Admitting: Emergency Medicine

## 2018-12-04 DIAGNOSIS — N390 Urinary tract infection, site not specified: Secondary | ICD-10-CM | POA: Diagnosis not present

## 2018-12-04 DIAGNOSIS — R0602 Shortness of breath: Secondary | ICD-10-CM | POA: Diagnosis not present

## 2018-12-04 DIAGNOSIS — F419 Anxiety disorder, unspecified: Secondary | ICD-10-CM | POA: Insufficient documentation

## 2018-12-04 DIAGNOSIS — J449 Chronic obstructive pulmonary disease, unspecified: Secondary | ICD-10-CM | POA: Diagnosis not present

## 2018-12-04 DIAGNOSIS — R531 Weakness: Secondary | ICD-10-CM | POA: Diagnosis present

## 2018-12-04 DIAGNOSIS — Z7982 Long term (current) use of aspirin: Secondary | ICD-10-CM | POA: Diagnosis not present

## 2018-12-04 DIAGNOSIS — Z20828 Contact with and (suspected) exposure to other viral communicable diseases: Secondary | ICD-10-CM | POA: Insufficient documentation

## 2018-12-04 DIAGNOSIS — Z79899 Other long term (current) drug therapy: Secondary | ICD-10-CM | POA: Insufficient documentation

## 2018-12-04 HISTORY — DX: Chronic obstructive pulmonary disease, unspecified: J44.9

## 2018-12-04 LAB — CBC WITH DIFFERENTIAL/PLATELET
Abs Immature Granulocytes: 0.02 10*3/uL (ref 0.00–0.07)
Basophils Absolute: 0 10*3/uL (ref 0.0–0.1)
Basophils Relative: 0 %
Eosinophils Absolute: 0.1 10*3/uL (ref 0.0–0.5)
Eosinophils Relative: 1 %
HCT: 39.2 % (ref 36.0–46.0)
Hemoglobin: 12.3 g/dL (ref 12.0–15.0)
Immature Granulocytes: 0 %
Lymphocytes Relative: 18 %
Lymphs Abs: 1.2 10*3/uL (ref 0.7–4.0)
MCH: 28.2 pg (ref 26.0–34.0)
MCHC: 31.4 g/dL (ref 30.0–36.0)
MCV: 89.9 fL (ref 80.0–100.0)
Monocytes Absolute: 0.6 10*3/uL (ref 0.1–1.0)
Monocytes Relative: 9 %
Neutro Abs: 4.9 10*3/uL (ref 1.7–7.7)
Neutrophils Relative %: 72 %
Platelets: 290 10*3/uL (ref 150–400)
RBC: 4.36 MIL/uL (ref 3.87–5.11)
RDW: 12.9 % (ref 11.5–15.5)
WBC: 6.9 10*3/uL (ref 4.0–10.5)
nRBC: 0 % (ref 0.0–0.2)

## 2018-12-04 LAB — BRAIN NATRIURETIC PEPTIDE: B Natriuretic Peptide: 50.4 pg/mL (ref 0.0–100.0)

## 2018-12-04 LAB — URINALYSIS, MICROSCOPIC (REFLEX): WBC, UA: >50 WBC/hpf (ref 0–5)

## 2018-12-04 LAB — POCT I-STAT EG7
Acid-Base Excess: 6 mmol/L — ABNORMAL HIGH (ref 0.0–2.0)
Bicarbonate: 32.9 mmol/L — ABNORMAL HIGH (ref 20.0–28.0)
Calcium, Ion: 1.2 mmol/L (ref 1.15–1.40)
HCT: 36 % (ref 36.0–46.0)
Hemoglobin: 12.2 g/dL (ref 12.0–15.0)
O2 Saturation: 22 %
Potassium: 3.3 mmol/L — ABNORMAL LOW (ref 3.5–5.1)
Sodium: 138 mmol/L (ref 135–145)
TCO2: 35 mmol/L — ABNORMAL HIGH (ref 22–32)
pCO2, Ven: 58.6 mmHg (ref 44.0–60.0)
pH, Ven: 7.357 (ref 7.250–7.430)
pO2, Ven: 17 mmHg — CL (ref 32.0–45.0)

## 2018-12-04 LAB — COMPREHENSIVE METABOLIC PANEL
ALT: 21 U/L (ref 0–44)
AST: 20 U/L (ref 15–41)
Albumin: 3.4 g/dL — ABNORMAL LOW (ref 3.5–5.0)
Alkaline Phosphatase: 83 U/L (ref 38–126)
Anion gap: 8 (ref 5–15)
BUN: 22 mg/dL (ref 8–23)
CO2: 29 mmol/L (ref 22–32)
Calcium: 8.7 mg/dL — ABNORMAL LOW (ref 8.9–10.3)
Chloride: 100 mmol/L (ref 98–111)
Creatinine, Ser: 0.64 mg/dL (ref 0.44–1.00)
GFR calc Af Amer: >60 mL/min (ref >60)
GFR calc non Af Amer: >60 mL/min (ref >60)
Glucose, Bld: 106 mg/dL — ABNORMAL HIGH (ref 70–99)
Potassium: 3.5 mmol/L (ref 3.5–5.1)
Sodium: 137 mmol/L (ref 135–145)
Total Bilirubin: 0.5 mg/dL (ref 0.3–1.2)
Total Protein: 6.6 g/dL (ref 6.5–8.1)

## 2018-12-04 LAB — URINALYSIS, ROUTINE W REFLEX MICROSCOPIC
Bilirubin Urine: NEGATIVE
Glucose, UA: NEGATIVE mg/dL
Ketones, ur: 15 mg/dL — AB
Nitrite: POSITIVE — AB
Protein, ur: NEGATIVE mg/dL
Specific Gravity, Urine: 1.025 (ref 1.005–1.030)
pH: 6 (ref 5.0–8.0)

## 2018-12-04 LAB — LACTIC ACID, PLASMA
Lactic Acid, Venous: 0.9 mmol/L (ref 0.5–1.9)
Lactic Acid, Venous: 0.7 mmol/L (ref 0.5–1.9)

## 2018-12-04 LAB — SARS CORONAVIRUS 2 AG (30 MIN TAT): SARS Coronavirus 2 Ag: NEGATIVE

## 2018-12-04 MED ORDER — CEPHALEXIN 500 MG PO CAPS: 500.0000 mg | ORAL_CAPSULE | Freq: Two times a day (BID) | ORAL | 0 refills | Status: AC

## 2018-12-04 MED ORDER — ALBUTEROL SULFATE HFA 108 (90 BASE) MCG/ACT IN AERS
6.0000 | INHALATION_SPRAY | Freq: Once | RESPIRATORY_TRACT | Status: AC
  Administered 2018-12-04: 16:00:00 6 via RESPIRATORY_TRACT
  Filled 2018-12-04: qty 6.7

## 2018-12-04 NOTE — Progress Notes (Addendum)
[facility] CM -referral [place] for [facility], will call dtr on tomorrow to discuss choices for [facility] agencies. Dtr did not have preference for [facility]. Jonnie Finner RN CCM Case Mgmt phone [phone]  12/05/2018 8:59 am Cheri Kearns with new referral. Jonnie Finner RN CCM Case Mgmt phone [phone]

## 2018-12-04 NOTE — ED Provider Notes (Signed)
[facility] EMERGENCY DEPARTMENT Provider Note   CSN: [id number] Arrival date & time: 12/04/18  1454    History   Chief Complaint Chief Complaint  Patient presents with  . COPD    HPI Beverly Adkins is a 85 y.o. female.     HPI    Presents with sleepiness/generalized weakness Has been throughout this week, started on Wednesday Generalized weakness, difficulty getting dressed or getting out of bed, falling asleep while getting dressed Is ambulatory with walker but not wanting to get out of bed, going more slowly  Has always had swelling in her legs, was supposed to be taking lasix but has not for 7 weeks O2 use has not been consistent or adequate, supposed to be on 2 all the time Seems like she was sleepy because not on O2 at first Not wanting to wear O2, has been staying with family now from [place], using mobile unit that is not constant flow and she will not use the other on  She has chronic dyspnea, doesn't appear to be worse Cough which is chronic, now seems like trying to cough something up Doesn't drink much water Skipped some meals but not big change in appetite No abd pain/chest pain/diarrhea Fell 6 times in the last 7 weeks since being home, fell yesterday Initially a few days ago had back pain now resolved  Supposed to be using symbicort, reports she is, supposed to be taking albuterol but not taking that, so she has not been taking that  Past Medical History:  Diagnosis Date  . Abrasion of cornea, left   . Anal ulcer   . Anxiety   . Aphthous ulcer   . Carotid artery stenosis   . COPD (chronic obstructive pulmonary disease) ([place])   . Elevated hemidiaphragm    Left  . Fracture of femur, shaft ([place])    closed, Right  . GERD (gastroesophageal reflux disease)   . Hyperkalemia   . Incontinence of urine    urge  . Osteoarthritis   . Osteoporosis   . Peripheral neuropathy     Patient Active Problem List   Diagnosis Date Noted  . Altered  mental status 10/18/2011  . Constipation 10/18/2011  . Fracture lumbar vertebra-closed ([place]) 10/18/2011  . 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 Elevated hemidiaphragm   . Anxiety   . Abrasion of cornea, left   . Aphthous ulcer   . Carotid artery stenosis   . GERD (gastroesophageal reflux disease)   . Hyperkalemia   . Osteoporosis   . Peripheral neuropathy   . Incontinence of urine     Past Surgical History:  Procedure Laterality Date  . CATARACT EXTRACTION    . CESAREAN SECTION    . FOOT SURGERY    . HIP SURGERY     right, Dr. Ninfa Linden  . neuroplasty decompression median nerve at carpal tunnel       OB History   No obstetric history on file.      Home Medications    Prior to Admission medications   Medication Sig Start Date End Date Taking? Authorizing Provider  albuterol (PROVENTIL HFA;VENTOLIN HFA) 108 (90 BASE) MCG/ACT inhaler Inhale 2 puffs into the lungs every 6 (six) hours as needed. Shortness of breath    [provider]  aspirin 81 MG tablet Take 81 mg by mouth daily.    [provider]  cephALEXin (KEFLEX) 500 MG capsule Take 1 capsule (500 mg total) by mouth 2 (two) times daily for  10 days. 12/04/18 12/14/18  Gareth Morgan, MD  clindamycin (CLINDAGEL) 1 % gel Apply topically 3 (three) times daily.    [provider]  denosumab (PROLIA) 60 MG/ML SOLN injection Inject 60 mg into the skin every 6 (six) months. Administer in upper arm, thigh, or abdomen    [provider]  diclofenac sodium (VOLTAREN) 1 % GEL Apply 1 application topically 4 (four) times daily.    [provider]  famotidine (PEPCID) 40 MG tablet Take 60 mg by mouth daily.     [provider]  Furosemide (LASIX PO) Take by mouth.    [provider]  GABAPENTIN PO Take by mouth.    [provider]  OMEPRAZOLE PO Take by mouth.    [provider]  oxybutynin (DITROPAN) 5 MG tablet Take 5 mg by mouth daily.      [provider]  Polyethyl Glycol-Propyl Glycol (SYSTANE ULTRA) 0.4-0.3 % SOLN Apply to eye 3 (three) times daily. One drop in both eyes    [provider]  sorbitol 70 % SOLN Take 20 mLs by mouth daily as needed (CONSTIPATION). 10/27/11   Mendel Corning, MD    Family History Family History  Problem Relation Age of Onset  . Alzheimer's disease Other   . Diabetes type II Other   . Hypertension Other   . Mental retardation Other   . Osteoporosis Other   . Parkinsonism Other   . Cancer Other   . Heart disease Other     Social History Social History   Tobacco Use  . Smoking status: Never Smoker  . Smokeless tobacco: Never Used  Substance Use Topics  . Alcohol use: No  . Drug use: No     Allergies   Patient has no known allergies.   Review of Systems Review of Systems  Constitutional: Positive for appetite change and fatigue. Negative for fever.  HENT: Negative for sore throat.   Eyes: Negative for visual disturbance.  Respiratory: Positive for cough (chronic) and shortness of breath (chronic unchanged).   Cardiovascular: Negative for chest pain.  Gastrointestinal: Negative for abdominal pain, diarrhea, nausea and vomiting.  Genitourinary: Negative for difficulty urinating and dysuria.  Musculoskeletal: Negative for back pain and neck pain.  Skin: Negative for rash.  Neurological: Negative for syncope, light-headedness and headaches.     Physical Exam Updated Vital Signs BP 132/78 (BP Location: Right Arm)   Pulse 88   Temp 98.2 F (36.8 C) (Oral)   Resp (!) 22   Wt 43 kg   SpO2 96%   BMI 19.81 kg/m   Physical Exam Vitals signs and nursing note reviewed.  Constitutional:      General: She is not in acute distress.    Appearance: She is well-developed and underweight. She is ill-appearing (chronically ill appearance). She is not diaphoretic.  HENT:     Head: Normocephalic and atraumatic.  Eyes:     Conjunctiva/sclera: Conjunctivae  normal.  Neck:     Musculoskeletal: Normal range of motion.  Cardiovascular:     Rate and Rhythm: Normal rate and regular rhythm.     Heart sounds: Normal heart sounds. No murmur. No friction rub. No gallop.   Pulmonary:     Effort: Pulmonary effort is normal. No respiratory distress.     Breath sounds: Normal breath sounds. No wheezing or rales.  Chest:     Comments: Diminished throughout Abdominal:     General: There is no distension.  Palpations: Abdomen is soft.     Tenderness: There is no abdominal tenderness. There is no guarding.  Musculoskeletal:        General: No tenderness.     Right lower leg: Edema present.     Left lower leg: Edema present.  Skin:    General: Skin is warm and dry.     Findings: No erythema or rash.  Neurological:     Mental Status: She is alert and oriented to person, place, and time.     Comments: Reports 2019 then says 2020 Appears fatigued      ED Treatments / Results  Labs (all labs ordered are listed, but only abnormal results are displayed) Labs Reviewed  COMPREHENSIVE METABOLIC PANEL - Abnormal; Notable for the following components:      Result Value   Glucose, Bld 106 (*)    Calcium 8.7 (*)    Albumin 3.4 (*)    All other components within normal limits  URINALYSIS, ROUTINE W REFLEX MICROSCOPIC - Abnormal; Notable for the following components:   APPearance CLOUDY (*)    Hgb urine dipstick MODERATE (*)    Ketones, ur 15 (*)    Nitrite POSITIVE (*)    Leukocytes,Ua LARGE (*)    All other components within normal limits  URINALYSIS, MICROSCOPIC (REFLEX) - Abnormal; Notable for the following components:   Bacteria, UA MANY (*)    All other components within normal limits  POCT I-STAT EG7 - Abnormal; Notable for the following components:   pO2, Ven 17.0 (*)    Bicarbonate 32.9 (*)    TCO2 35 (*)    Acid-Base Excess 6.0 (*)    Potassium 3.3 (*)    All other components within normal limits  SARS CORONAVIRUS 2 (HOSP ORDER,  PERFORMED IN [facility] VIA ABBOTT ID)  URINE CULTURE  CBC WITH DIFFERENTIAL/PLATELET  BRAIN NATRIURETIC PEPTIDE  LACTIC ACID, PLASMA  LACTIC ACID, PLASMA  I-STAT VENOUS BLOOD GAS, ED    EKG EKG Interpretation  Date/Time:  Monday Dec 04 2018 16:11:11 EDT Ventricular Rate:  93 PR Interval:    QRS Duration: 85 QT Interval:  340 QTC Calculation: 423 R Axis:   64 Text Interpretation:  Sinus rhythm Ventricular premature complex Abnormal R-wave progression, early transition No significant change since last tracing Confirmed by Gareth Morgan [phone]) on 12/04/2018 5:41:49 PM Also confirmed by Gareth Morgan [phone]), editor Philomena Doheny [phone])  on 12/05/2018 7:10:43 AM   Radiology Ct Head Wo Contrast  Result Date: 12/04/2018 CLINICAL DATA:  Headache, head trauma, extensive sleepiness, falling asleep while toilet thing, has not taken her Lasix for over 7 weeks, history COPD EXAM: CT HEAD WITHOUT CONTRAST TECHNIQUE: Contiguous axial images were obtained from the base of the skull through the vertex without intravenous contrast. Sagittal and coronal MPR images reconstructed from axial data set. COMPARISON:  10/19/2013 FINDINGS: Brain: Scattered motion artifacts. Generalized atrophy. No midline shift or mass effect. Mild small vessel chronic ischemic changes of deep cerebral white matter. No intracranial hemorrhage, mass lesion, or evidence acute infarction. No extra-axial fluid collections. Vascular: Atherosclerotic calcifications of internal carotid arteries at skull base Skull: Within limitations of motion artifacts, grossly intact Sinuses/Orbits: Clear Other: N/A IMPRESSION: Atrophy with small vessel chronic ischemic changes of deep cerebral white matter. No acute intracranial abnormalities identified on exam mildly limited by patient motion. Electronically Signed   By: Lavonia Dana M.D.   On: 12/04/2018 19:37   Dg Chest Portable 1 View  Result Date: 12/04/2018  CLINICAL DATA:  Generalized  weakness. EXAM: PORTABLE CHEST 1 VIEW COMPARISON:  Chest x-ray dated 12/28/2017 FINDINGS: The cardiac silhouette is somewhat shifted to the right likely secondary to the significant elevation of the patient's left hemidiaphragm. This somewhat limits evaluation of the chest. Chronic lung changes are again noted. There is no large pleural effusion or pneumothorax. No acute osseous abnormality detected. IMPRESSION: No active disease. Electronically Signed   By: Constance Holster M.D.   On: 12/04/2018 17:18    Procedures Procedures (including critical care time)  Medications Ordered in ED Medications  albuterol (VENTOLIN HFA) 108 (90 Base) MCG/ACT inhaler 6 puff (6 puffs Inhalation Given 12/04/18 1614)     Initial Impression / Assessment and Plan / ED Course  I have reviewed the triage vital signs and the nursing notes.  Pertinent labs & imaging results that were available during my care of the patient were reviewed by me and considered in my medical decision making (see chart for details).        85 year old female with a history of COPD, congenital diaphragm abnormality, who presents with concern for generalized weakness and sleepiness.  DDx includes infection, dehydration, arrhythmia, anemia, electrolyte abnormality, ICH, hypercarbia, CHF exacerbation, hypoxia with patient refusing to wear her O2 at home.   Daughter reports breathing appears to be at baseline.    Labs show no acute abnormalities. Sinus rhythm on ECG without acute findigns. No findings to suggest CHF exacerbation. Vital signs stable. No signs of pneumonia, negative COVID testing and low suspicion for this.    UA, head CT and VBG pending at this time.   Head CT and VBG without acute abnormalities  UA shows UTI. She is hemodynamically stable, normal WBC, normal lactic acid and is appropriate for outpatient treatment of UTI. Given rx for keflex. Recommend wearing O2 as prescribed.  Discussed that she does not look  significantly volume overloaded at this time despite not taking lasix and may discuss this medication with PCP, as well as discussing palliative care given age and patient's desire to not take medications. Placed order for home health.  Patient discharged in stable condition with understanding of reasons to return.   Final Clinical Impressions(s) / ED Diagnoses   Final diagnoses:  Urinary tract infection without hematuria, site unspecified  Generalized weakness    ED Discharge Orders         Ordered    cephALEXin (KEFLEX) 500 MG capsule  2 times daily     12/04/18 2007           Gareth Morgan, MD 12/05/18 1010

## 2018-12-04 NOTE — ED Triage Notes (Signed)
Pt sent here from [facility]. Pt had presented with excessive sleepiness, and falling asleep while toileting. Per daughter pt has not taken her Lasix for > 7 weeks. Pt also has not been compliant with wearing O2. Pt is a resident of the [facility], but is temporarily staying with daughter.

## 2018-12-06 LAB — URINE CULTURE: Culture: >=100000 — AB

## 2018-12-07 ENCOUNTER — Telehealth: Payer: Self-pay | Admitting: *Deleted

## 2018-12-07 NOTE — Telephone Encounter (Signed)
Post ED Visit - Positive Culture Follow-up  Culture report reviewed by antimicrobial stewardship pharmacist: [place] Team []  Elenor Quinones, Pharm.D. []  Heide Guile, Pharm.D., BCPS AQ-ID []  Parks Neptune, Pharm.D., BCPS []  Alycia Rossetti, Pharm.D., BCPS []  [place], Pharm.D., BCPS, AAHIVP []  Legrand Como, Pharm.D., BCPS, AAHIVP []  Salome Arnt, PharmD, BCPS []  Johnnette Gourd, PharmD, BCPS []  Hughes Better, PharmD, BCPS []  Leeroy Cha, PharmD []  Laqueta Linden, PharmD, BCPS []  Albertina Parr, PharmD Leron Croak, PharmD  [place] Team []  Leodis Sias, PharmD []  Lindell Spar, PharmD []  Royetta Asal, PharmD []  Graylin Shiver, Rph []  Rema Fendt) Glennon Mac, PharmD []  Arlyn Dunning, PharmD []  Netta Cedars, PharmD []  Dia Sitter, PharmD []  Leone Haven, PharmD []  Gretta Arab, PharmD []  Theodis Shove, PharmD []  Peggyann Juba, PharmD []  Reuel Boom, PharmD   Positive urine culture Treated with Cephalexin, organism sensitive to the same and no further patient follow-up is required at this time.  Harlon Flor [facility] 12/07/2018, 8:43 AM

## 2019-01-19 ENCOUNTER — Other Ambulatory Visit: Payer: Self-pay

## 2019-01-19 ENCOUNTER — Encounter (HOSPITAL_COMMUNITY): Payer: Self-pay | Admitting: Internal Medicine

## 2019-01-19 ENCOUNTER — Inpatient Hospital Stay (HOSPITAL_COMMUNITY)
Admission: EM | Admit: 2019-01-19 | Discharge: 2019-01-24 | DRG: 641 | Disposition: A | Discharge status: Skilled Nursing Facility | Payer: Medicare Other | Source: Skilled Nursing Facility | Attending: Internal Medicine | Admitting: Internal Medicine

## 2019-01-19 ENCOUNTER — Emergency Department (HOSPITAL_COMMUNITY): Payer: Medicare Other

## 2019-01-19 DIAGNOSIS — R296 Repeated falls: Secondary | ICD-10-CM | POA: Diagnosis present

## 2019-01-19 DIAGNOSIS — E876 Hypokalemia: Principal | ICD-10-CM | POA: Diagnosis present

## 2019-01-19 DIAGNOSIS — Z810 Family history of intellectual disabilities: Secondary | ICD-10-CM

## 2019-01-19 DIAGNOSIS — Z79899 Other long term (current) drug therapy: Secondary | ICD-10-CM

## 2019-01-19 DIAGNOSIS — R4182 Altered mental status, unspecified: Secondary | ICD-10-CM

## 2019-01-19 DIAGNOSIS — Z820 Family history of epilepsy and other diseases of the nervous system: Secondary | ICD-10-CM

## 2019-01-19 DIAGNOSIS — K219 Gastro-esophageal reflux disease without esophagitis: Secondary | ICD-10-CM | POA: Diagnosis present

## 2019-01-19 DIAGNOSIS — I10 Essential (primary) hypertension: Secondary | ICD-10-CM | POA: Diagnosis present

## 2019-01-19 DIAGNOSIS — Z9181 History of falling: Secondary | ICD-10-CM

## 2019-01-19 DIAGNOSIS — R55 Syncope and collapse: Secondary | ICD-10-CM | POA: Diagnosis present

## 2019-01-19 DIAGNOSIS — C799 Secondary malignant neoplasm of unspecified site: Secondary | ICD-10-CM | POA: Diagnosis present

## 2019-01-19 DIAGNOSIS — Z7982 Long term (current) use of aspirin: Secondary | ICD-10-CM

## 2019-01-19 DIAGNOSIS — J449 Chronic obstructive pulmonary disease, unspecified: Secondary | ICD-10-CM | POA: Diagnosis present

## 2019-01-19 DIAGNOSIS — Z888 Allergy status to other drugs, medicaments and biological substances status: Secondary | ICD-10-CM

## 2019-01-19 DIAGNOSIS — F0390 Unspecified dementia without behavioral disturbance: Secondary | ICD-10-CM | POA: Diagnosis present

## 2019-01-19 DIAGNOSIS — R600 Localized edema: Secondary | ICD-10-CM | POA: Diagnosis present

## 2019-01-19 DIAGNOSIS — Z833 Family history of diabetes mellitus: Secondary | ICD-10-CM

## 2019-01-19 DIAGNOSIS — R531 Weakness: Secondary | ICD-10-CM

## 2019-01-19 DIAGNOSIS — R339 Retention of urine, unspecified: Secondary | ICD-10-CM | POA: Diagnosis present

## 2019-01-19 DIAGNOSIS — Z8262 Family history of osteoporosis: Secondary | ICD-10-CM

## 2019-01-19 DIAGNOSIS — Z87440 Personal history of urinary (tract) infections: Secondary | ICD-10-CM

## 2019-01-19 DIAGNOSIS — Z9114 Patient's other noncompliance with medication regimen: Secondary | ICD-10-CM

## 2019-01-19 DIAGNOSIS — Z66 Do not resuscitate: Secondary | ICD-10-CM | POA: Diagnosis present

## 2019-01-19 DIAGNOSIS — M810 Age-related osteoporosis without current pathological fracture: Secondary | ICD-10-CM | POA: Diagnosis present

## 2019-01-19 DIAGNOSIS — Z1159 Encounter for screening for other viral diseases: Secondary | ICD-10-CM

## 2019-01-19 DIAGNOSIS — Z8249 Family history of ischemic heart disease and other diseases of the circulatory system: Secondary | ICD-10-CM

## 2019-01-19 DIAGNOSIS — F419 Anxiety disorder, unspecified: Secondary | ICD-10-CM | POA: Diagnosis present

## 2019-01-19 DIAGNOSIS — W19XXXA Unspecified fall, initial encounter: Secondary | ICD-10-CM

## 2019-01-19 LAB — CBC WITH DIFFERENTIAL/PLATELET
Abs Immature Granulocytes: 0.02 10*3/uL (ref 0.00–0.07)
Basophils Absolute: 0 10*3/uL (ref 0.0–0.1)
Basophils Relative: 0 %
Eosinophils Absolute: 0 10*3/uL (ref 0.0–0.5)
Eosinophils Relative: 0 %
HCT: 40 % (ref 36.0–46.0)
Hemoglobin: 12.7 g/dL (ref 12.0–15.0)
Immature Granulocytes: 0 %
Lymphocytes Relative: 8 %
Lymphs Abs: 0.7 10*3/uL (ref 0.7–4.0)
MCH: 28.2 pg (ref 26.0–34.0)
MCHC: 31.8 g/dL (ref 30.0–36.0)
MCV: 88.9 fL (ref 80.0–100.0)
Monocytes Absolute: 0.6 10*3/uL (ref 0.1–1.0)
Monocytes Relative: 7 %
Neutro Abs: 6.6 10*3/uL (ref 1.7–7.7)
Neutrophils Relative %: 85 %
Platelets: 233 10*3/uL (ref 150–400)
RBC: 4.5 MIL/uL (ref 3.87–5.11)
RDW: 13.8 % (ref 11.5–15.5)
WBC: 7.9 10*3/uL (ref 4.0–10.5)
nRBC: 0 % (ref 0.0–0.2)

## 2019-01-19 LAB — URINALYSIS, ROUTINE W REFLEX MICROSCOPIC
Bilirubin Urine: NEGATIVE
Glucose, UA: NEGATIVE mg/dL
Hgb urine dipstick: NEGATIVE
Ketones, ur: 20 mg/dL — AB
Leukocytes,Ua: NEGATIVE
Nitrite: NEGATIVE
Protein, ur: 30 mg/dL — AB
Specific Gravity, Urine: 1.012 (ref 1.005–1.030)
pH: 8 (ref 5.0–8.0)

## 2019-01-19 LAB — COMPREHENSIVE METABOLIC PANEL
ALT: 24 U/L (ref 0–44)
AST: 35 U/L (ref 15–41)
Albumin: 3.6 g/dL (ref 3.5–5.0)
Alkaline Phosphatase: 68 U/L (ref 38–126)
Anion gap: 11 (ref 5–15)
BUN: 25 mg/dL — ABNORMAL HIGH (ref 8–23)
CO2: 30 mmol/L (ref 22–32)
Calcium: 8.7 mg/dL — ABNORMAL LOW (ref 8.9–10.3)
Chloride: 98 mmol/L (ref 98–111)
Creatinine, Ser: 0.66 mg/dL (ref 0.44–1.00)
GFR calc Af Amer: >60 mL/min (ref >60)
GFR calc non Af Amer: >60 mL/min (ref >60)
Glucose, Bld: 115 mg/dL — ABNORMAL HIGH (ref 70–99)
Potassium: 2.6 mmol/L — CL (ref 3.5–5.1)
Sodium: 139 mmol/L (ref 135–145)
Total Bilirubin: 0.7 mg/dL (ref 0.3–1.2)
Total Protein: 6.3 g/dL — ABNORMAL LOW (ref 6.5–8.1)

## 2019-01-19 LAB — SARS CORONAVIRUS 2 BY RT PCR (HOSPITAL ORDER, PERFORMED IN ~~LOC~~ HOSPITAL LAB): SARS Coronavirus 2: NEGATIVE

## 2019-01-19 LAB — CK TOTAL AND CKMB (NOT AT ARMC)
CK, MB: 13.5 ng/mL — ABNORMAL HIGH (ref 0.5–5.0)
Relative Index: 3.2 — ABNORMAL HIGH (ref 0.0–2.5)
Total CK: 428 U/L — ABNORMAL HIGH (ref 38–234)

## 2019-01-19 MED ORDER — ACETAMINOPHEN 650 MG RE SUPP: 650.0000 mg | Freq: Four times a day (QID) | RECTAL | Status: DC | PRN

## 2019-01-19 MED ORDER — SODIUM CHLORIDE 0.9 % IV BOLUS
500.0000 mL | Freq: Once | INTRAVENOUS | Status: AC
  Administered 2019-01-19: 500 mL via INTRAVENOUS

## 2019-01-19 MED ORDER — SODIUM CHLORIDE 0.9 % IV BOLUS
1000.0000 mL | Freq: Once | INTRAVENOUS | Status: AC
  Administered 2019-01-19: 1000 mL via INTRAVENOUS

## 2019-01-19 MED ORDER — ONDANSETRON HCL 4 MG PO TABS: 4.0000 mg | ORAL_TABLET | Freq: Four times a day (QID) | ORAL | Status: DC | PRN

## 2019-01-19 MED ORDER — SODIUM CHLORIDE 0.9 % IV SOLN
INTRAVENOUS | Status: DC
  Administered 2019-01-19: 23:00:00 via INTRAVENOUS

## 2019-01-19 MED ORDER — POTASSIUM CHLORIDE 10 MEQ/100ML IV SOLN
10.0000 meq | Freq: Once | INTRAVENOUS | Status: AC
  Administered 2019-01-19: 10 meq via INTRAVENOUS
  Filled 2019-01-19: qty 100

## 2019-01-19 MED ORDER — ENOXAPARIN SODIUM 40 MG/0.4ML ~~LOC~~ SOLN
40.0000 mg | SUBCUTANEOUS | Status: DC
  Administered 2019-01-19 – 2019-01-20 (×2): 40 mg via SUBCUTANEOUS
  Filled 2019-01-19 (×2): qty 0.4

## 2019-01-19 MED ORDER — POTASSIUM CHLORIDE CRYS ER 20 MEQ PO TBCR
40.0000 meq | EXTENDED_RELEASE_TABLET | Freq: Once | ORAL | Status: AC
  Administered 2019-01-19: 40 meq via ORAL
  Filled 2019-01-19: qty 2

## 2019-01-19 MED ORDER — ACETAMINOPHEN 325 MG PO TABS: 650.0000 mg | ORAL_TABLET | Freq: Four times a day (QID) | ORAL | Status: DC | PRN

## 2019-01-19 MED ORDER — ONDANSETRON HCL 4 MG/2ML IJ SOLN: 4.0000 mg | Freq: Four times a day (QID) | INTRAMUSCULAR | Status: DC | PRN

## 2019-01-19 NOTE — ED Notes (Signed)
Urine culture sent down with urine sample 

## 2019-01-19 NOTE — ED Notes (Signed)
ED TO INPATIENT HANDOFF REPORT  ED Nurse Name and Phone #: [phone] Beverly Adkins Name/Age/Gender Beverly Adkins 85 y.o. female Room/Bed: 032C/032C  Code Status   Code Status: DNR  Home/SNF/Other Home Patient oriented to: self, place, time and situation Is this baseline? Yes   Triage Complete: Triage complete  Chief Complaint AMS; Recent UTI  Triage Note Pt BIB [facility] for altered mental status. Per EMS patient was treated for a UTI three weeks ago and took the full course of antibiotics. Patient's family reports they found the patient on the floor in her bathroom and that she was more altered than normal. Family last saw her yesterday. Pt also has a more unsteady gait than normal per family. Suspected unwitnessed fall per EMS. Pt placed in a c-collar by EMS.  Pt Daughter Beverly Adkins [phone]   Allergies Allergies  Allergen Reactions  . Tape Other (See Comments)    SKIN IS THIN AND WILL TEAR EASILY!!    Level of Care/Admitting Diagnosis ED Disposition    ED Disposition Condition Comment   Admit  Hospital Area: [facility] [100100]  Level of Care: Telemetry Medical [104]  I expect the patient will be discharged within 24 hours: No (not a candidate for 5C-Observation unit)  Covid Evaluation: Screening Protocol (No Symptoms)  Diagnosis: Near syncope [646803]  Admitting Physician: Rise Patience [phone]  Attending Physician: Rise Patience [[id number]]  PT Class (Do Not Modify): Observation [104]  PT Acc Code (Do Not Modify): Observation [10022]       B Medical/Surgery History Past Medical History:  Diagnosis Date  . Abrasion of cornea, left   . Anal ulcer   . Anxiety   . Aphthous ulcer   . Carotid artery stenosis   . COPD (chronic obstructive pulmonary disease) ([place])   . Elevated hemidiaphragm    Left  . Fracture of femur, shaft ([place])    closed, Right  . GERD (gastroesophageal reflux disease)   . Hyperkalemia   . Incontinence of urine     urge  . Osteoarthritis   . Osteoporosis   . Peripheral neuropathy    Past Surgical History:  Procedure Laterality Date  . CATARACT EXTRACTION    . CESAREAN SECTION    . FOOT SURGERY    . HIP SURGERY     right, Dr. Ninfa Linden  . neuroplasty decompression median nerve at carpal tunnel       A IV Location/Drains/Wounds Patient Lines/Drains/Airways Status   Active Line/Drains/Airways    Name:   Placement date:   Placement time:   Site:   Days:   Peripheral IV 01/19/19 Right Forearm   01/19/19    1644    Forearm   less than 1   Pressure Ulcer 10/24/11 Stage II -  Partial thickness loss of dermis presenting as a shallow open ulcer with a red, pink wound bed without slough. 1 close blister and 2 open ones   10/24/11    2000     2644          Intake/Output Last 24 hours  Intake/Output Summary (Last 24 hours) at 01/19/2019 2044 Last data filed at 01/19/2019 1920 Gross per 24 hour  Intake 1100 ml  Output -  Net 1100 ml    Labs/Imaging Results for orders placed or performed during the hospital encounter of 01/19/19 (from the past 48 hour(s))  Comprehensive metabolic panel     Status: Abnormal   Collection Time: 01/19/19  4:38 PM  Result Value Ref Range   Sodium 139 135 - 145 mmol/L   Potassium 2.6 (LL) 3.5 - 5.1 mmol/L    Comment: CRITICAL RESULT CALLED TO, READ BACK BY AND VERIFIED WITH: N KOOTNZ,RN 1746 01/19/2019 WBOND    Chloride 98 98 - 111 mmol/L   CO2 30 22 - 32 mmol/L   Glucose, Bld 115 (H) 70 - 99 mg/dL   BUN 25 (H) 8 - 23 mg/dL   Creatinine, Ser 0.66 0.44 - 1.00 mg/dL   Calcium 8.7 (L) 8.9 - 10.3 mg/dL   Total Protein 6.3 (L) 6.5 - 8.1 g/dL   Albumin 3.6 3.5 - 5.0 g/dL   AST 35 15 - 41 U/L   ALT 24 0 - 44 U/L   Alkaline Phosphatase 68 38 - 126 U/L   Total Bilirubin 0.7 0.3 - 1.2 mg/dL   GFR calc non Af Amer >60 >60 mL/min   GFR calc Af Amer >60 >60 mL/min   Anion gap 11 5 - 15    Comment: Performed at [facility] [address], [place], [place]  [address]  CBC with Differential     Status: None   Collection Time: 01/19/19  4:38 PM  Result Value Ref Range   WBC 7.9 4.0 - 10.5 K/uL   RBC 4.50 3.87 - 5.11 MIL/uL   Hemoglobin 12.7 12.0 - 15.0 g/dL   HCT 40.0 36.0 - 46.0 %   MCV 88.9 80.0 - 100.0 fL   MCH 28.2 26.0 - 34.0 pg   MCHC 31.8 30.0 - 36.0 g/dL   RDW 13.8 11.5 - 15.5 %   Platelets 233 150 - 400 K/uL   nRBC 0.0 0.0 - 0.2 %   Neutrophils Relative % 85 %   Neutro Abs 6.6 1.7 - 7.7 K/uL   Lymphocytes Relative 8 %   Lymphs Abs 0.7 0.7 - 4.0 K/uL   Monocytes Relative 7 %   Monocytes Absolute 0.6 0.1 - 1.0 K/uL   Eosinophils Relative 0 %   Eosinophils Absolute 0.0 0.0 - 0.5 K/uL   Basophils Relative 0 %   Basophils Absolute 0.0 0.0 - 0.1 K/uL   Immature Granulocytes 0 %   Abs Immature Granulocytes 0.02 0.00 - 0.07 K/uL    Comment: Performed at [facility], [address], [place], [place] [address]  Urinalysis, Routine w reflex microscopic     Status: Abnormal   Collection Time: 01/19/19  6:00 PM  Result Value Ref Range   Color, Urine YELLOW YELLOW   APPearance HAZY (A) CLEAR   Specific Gravity, Urine 1.012 1.005 - 1.030   pH 8.0 5.0 - 8.0   Glucose, UA NEGATIVE NEGATIVE mg/dL   Hgb urine dipstick NEGATIVE NEGATIVE   Bilirubin Urine NEGATIVE NEGATIVE   Ketones, ur 20 (A) NEGATIVE mg/dL   Protein, ur 30 (A) NEGATIVE mg/dL   Nitrite NEGATIVE NEGATIVE   Leukocytes,Ua NEGATIVE NEGATIVE   RBC / HPF 0-5 0 - 5 RBC/hpf   WBC, UA 0-5 0 - 5 WBC/hpf   Bacteria, UA RARE (A) NONE SEEN   Squamous Epithelial / LPF 0-5 0 - 5    Comment: Performed at [facility], [place] [address], [place], [place] [address]  CK total and CKMB (cardiac) not at [facility]     Status: Abnormal   Collection Time: 01/19/19  7:19 PM  Result Value Ref Range   Total CK 428 (H) 38 - 234 U/L   CK, MB 13.5 (H)  0.5 - 5.0 ng/mL   Relative Index 3.2 (H) 0.0 - 2.5    Comment: Performed at [facility], [place] [address] [place],  [address]    No results found.  Pending Labs Unresulted Labs (From admission, onward)    Start     Ordered   01/19/19 1917  SARS Coronavirus 2 (CEPHEID - Performed in [place] [facility] hospital lab), Hosp Order  (Asymptomatic Patients Labs)  Once,   STAT    Question:  Rule Out  Answer:  Yes   01/19/19 1916   Signed and Held  Basic metabolic panel  Tomorrow morning,   R     Signed and Held   Signed and Held  CBC  Tomorrow morning,   R     Signed and Held   Signed and Held  CBC  (enoxaparin (LOVENOX)    CrCl >/= 30 ml/min)  Once,   R    Comments: Baseline for enoxaparin therapy IF NOT ALREADY DRAWN.  Notify MD if PLT < 100 K.    Signed and Held   Signed and Held  Creatinine, serum  (enoxaparin (LOVENOX)    CrCl >/= 30 ml/min)  Once,   R    Comments: Baseline for enoxaparin therapy IF NOT ALREADY DRAWN.    Signed and Held   Signed and Held  Creatinine, serum  (enoxaparin (LOVENOX)    CrCl >/= 30 ml/min)  Weekly,   R    Comments: while on enoxaparin therapy    Signed and Held   Signed and Held  Magnesium  Once,   R     Signed and Held   Signed and Held  Troponin I - Now Then Q6H  Now then every 6 hours,   R     Signed and Held          Vitals/Pain Today's Vitals   01/19/19 1700 01/19/19 1715 01/19/19 1800 01/19/19 1900  BP: (!) 159/73 (!) 175/81 (!) 173/80 139/65  Pulse: 90 97 95 90  Resp: 12 14 12 12   Temp:      TempSrc:      SpO2: 100% 100% 100% 100%  Weight:      Height:      PainSc:        Isolation Precautions No active isolations  Medications Medications  sodium chloride 0.9 % bolus 1,000 mL (0 mLs Intravenous Stopped 01/19/19 1813)  potassium chloride 10 mEq in 100 mL IVPB (0 mEq Intravenous Stopped 01/19/19 1920)  potassium chloride SA (K-DUR) CR tablet 40 mEq (40 mEq Oral Given 01/19/19 1813)  sodium chloride 0.9 % bolus 500 mL (500 mLs Intravenous New Bag/Given 01/19/19 1921)    Mobility walks with person assist High fall risk   Focused  Assessments .   R Recommendations: See Admitting Provider Note  Report given to:   Additional Notes:

## 2019-01-19 NOTE — ED Triage Notes (Signed)
Pt BIB [facility] for altered mental status. Per EMS patient was treated for a UTI three weeks ago and took the full course of antibiotics. Patient's family reports they found the patient on the floor in her bathroom and that she was more altered than normal. Family last saw her yesterday. Pt also has a more unsteady gait than normal per family. Suspected unwitnessed fall per EMS. Pt placed in a c-collar by EMS.  Pt Daughter [name] [phone]

## 2019-01-19 NOTE — ED Notes (Signed)
Report called to [place] on 3W.

## 2019-01-19 NOTE — H&P (Signed)
History and Physical    Beverly Adkins WUJ:[id number] DOB: Nov 27, 1924 DOA: 01/19/2019  PCP: Shawna Clamp, MD  Patient coming from: Independent living facility.  History obtained from patient's daughter.  Chief Complaint: Fall and confusion.  HPI: Beverly Adkins is a 85 y.o. female with history of dementia, hypertension, chronic lower extremity edema COPD who has not been taking any of her medications was brought to the ER after patient was found on the floor in the bathroom of her living facility.  Per daughter patient has been living with her last 2 months since recent COVID pandemic and was placed back in a 20 pending facility yesterday.  Family went to check on her today and was found on the floor confused more than normal.  Prior to which patient did not have any chest pain or shortness of breath nausea vomiting or diarrhea.  ED Course: In the ER patient is oriented to name follows commands moves all extremities generally weak.  CT head and C-spine did not show anything acute does show some clear sclerotic changes concerning for metastatic disease.  EKG shows normal sinus rhythm.  Labs revealed severe hypokalemia of 2.6.  Troponin 0.03 CK 428.  Given the changes patient is being admitted for observation and may need higher level of care.  Potassium replacement was given.  Review of Systems: As per HPI, rest all negative.   Past Medical History:  Diagnosis Date  . Abrasion of cornea, left   . Anal ulcer   . Anxiety   . Aphthous ulcer   . Carotid artery stenosis   . COPD (chronic obstructive pulmonary disease) ([place])   . Elevated hemidiaphragm    Left  . Fracture of femur, shaft ([place])    closed, Right  . GERD (gastroesophageal reflux disease)   . Hyperkalemia   . Incontinence of urine    urge  . Osteoarthritis   . Osteoporosis   . Peripheral neuropathy     Past Surgical History:  Procedure Laterality Date  . CATARACT EXTRACTION    . CESAREAN SECTION    . FOOT SURGERY    .  HIP SURGERY     right, Dr. Ninfa Linden  . neuroplasty decompression median nerve at carpal tunnel       reports that she has never smoked. She has never used smokeless tobacco. She reports that she does not drink alcohol or use drugs.  Allergies  Allergen Reactions  . Tape Other (See Comments)    SKIN IS THIN AND WILL TEAR EASILY!!    Family History  Problem Relation Age of Onset  . Alzheimer's disease Other   . Diabetes type II Other   . Hypertension Other   . Mental retardation Other   . Osteoporosis Other   . Parkinsonism Other   . Cancer Other   . Heart disease Other     Prior to Admission medications   Medication Sig Start Date End Date Taking? Authorizing Provider  albuterol (PROVENTIL HFA;VENTOLIN HFA) 108 (90 BASE) MCG/ACT inhaler Inhale 2 puffs into the lungs every 6 (six) hours as needed. Shortness of breath    [provider]  aspirin 81 MG tablet Take 81 mg by mouth daily.    [provider]  clindamycin (CLINDAGEL) 1 % gel Apply topically 3 (three) times daily.    [provider]  denosumab (PROLIA) 60 MG/ML SOLN injection Inject 60 mg into the skin every 6 (six) months. Administer in upper arm, thigh, or abdomen    [provider]  diclofenac sodium (VOLTAREN) 1 % GEL Apply 1 application topically 4 (four) times daily.    [provider]  famotidine (PEPCID) 40 MG tablet Take 60 mg by mouth daily.     [provider]  Furosemide (LASIX PO) Take by mouth.    [provider]  GABAPENTIN PO Take by mouth.    [provider]  OMEPRAZOLE PO Take by mouth.    [provider]  oxybutynin (DITROPAN) 5 MG tablet Take 5 mg by mouth daily.     [provider]  Polyethyl Glycol-Propyl Glycol (SYSTANE ULTRA) 0.4-0.3 % SOLN Apply to eye 3 (three) times daily. One drop in both eyes    [provider]  sorbitol 70 % SOLN Take 20 mLs by mouth daily as needed (CONSTIPATION). 10/27/11    Mendel Corning, MD    Physical Exam: Vitals:   01/19/19 1700 01/19/19 1715 01/19/19 1800 01/19/19 1900  BP: (!) 159/73 (!) 175/81 (!) 173/80 139/65  Pulse: 90 97 95 90  Resp: 12 14 12 12   Temp:      TempSrc:      SpO2: 100% 100% 100% 100%  Weight:      Height:          Constitutional: Moderately built and nourished. Vitals:   01/19/19 1700 01/19/19 1715 01/19/19 1800 01/19/19 1900  BP: (!) 159/73 (!) 175/81 (!) 173/80 139/65  Pulse: 90 97 95 90  Resp: 12 14 12 12   Temp:      TempSrc:      SpO2: 100% 100% 100% 100%  Weight:      Height:       Eyes: Anicteric no pallor. ENMT: No discharge from the ears eyes nose or mouth. Neck: No mass felt.  No neck rigidity no JVD appreciated. Respiratory: No rhonchi or crepitations. Cardiovascular: S1-S2 heard. Abdomen: Soft nontender bowel sounds present. Musculoskeletal: Bilateral lower extremity edema which as per the family is chronic. Skin: Chronic skin changes of the lower extremity. Neurologic: Alert awake oriented to her name.  Moves all extremities. Psychiatric: Oriented to her name.   Labs on Admission: I have personally reviewed following labs and imaging studies  CBC: Recent Labs  Lab 01/19/19 1638  WBC 7.9  NEUTROABS 6.6  HGB 12.7  HCT 40.0  MCV 88.9  PLT 299   Basic Metabolic Panel: Recent Labs  Lab 01/19/19 1638  NA 139  K 2.6*  CL 98  CO2 30  GLUCOSE 115*  BUN 25*  CREATININE 0.66  CALCIUM 8.7*   GFR: Estimated Creatinine Clearance: 27.3 mL/min (by C-G formula based on SCr of 0.66 mg/dL). Liver Function Tests: Recent Labs  Lab 01/19/19 1638  AST 35  ALT 24  ALKPHOS 68  BILITOT 0.7  PROT 6.3*  ALBUMIN 3.6   No results for input(s): LIPASE, AMYLASE in the last 168 hours. No results for input(s): AMMONIA in the last 168 hours. Coagulation Profile: No results for input(s): INR, PROTIME in the last 168 hours. Cardiac Enzymes: Recent Labs  Lab 01/19/19 1919  CKTOTAL 428*  CKMB 13.5*    BNP (last 3 results) No results for input(s): PROBNP in the last 8760 hours. HbA1C: No results for input(s): HGBA1C in the last 72 hours. CBG: No results for input(s): GLUCAP in the last 168 hours. Lipid Profile: No results for input(s): CHOL, HDL, LDLCALC, TRIG, CHOLHDL, LDLDIRECT in the last 72 hours. Thyroid Function Tests: No results for input(s): TSH, T4TOTAL, FREET4, T3FREE, THYROIDAB  in the last 72 hours. Anemia Panel: No results for input(s): VITAMINB12, FOLATE, FERRITIN, TIBC, IRON, RETICCTPCT in the last 72 hours. Urine analysis:    Component Value Date/Time   COLORURINE YELLOW 01/19/2019 1800   APPEARANCEUR HAZY (A) 01/19/2019 1800   LABSPEC 1.012 01/19/2019 1800   PHURINE 8.0 01/19/2019 1800   GLUCOSEU NEGATIVE 01/19/2019 1800   HGBUR NEGATIVE 01/19/2019 1800   BILIRUBINUR NEGATIVE 01/19/2019 1800   KETONESUR 20 (A) 01/19/2019 1800   PROTEINUR 30 (A) 01/19/2019 1800   UROBILINOGEN 0.2 10/19/2013 2012   NITRITE NEGATIVE 01/19/2019 1800   LEUKOCYTESUR NEGATIVE 01/19/2019 1800   Sepsis Labs: @LABRCNTIP (procalcitonin:4,lacticidven:4) )No results found for this or any previous visit (from the past 240 hour(s)).   Radiological Exams on Admission: No results found.  EKG: Independently reviewed.  Normal sinus rhythm.  Assessment/Plan Principal Problem:   Near syncope Active Problems:   Hypokalemia   Dementia ([place])    1. Near syncope/fall -cause not clear.  Since patient was appearing more confused than normal will get MRI brain.  Physical therapy consult social work consult. 2. Severe hypokalemia cause not clear.  Replace recheck check magnesium. 3. Chronic lower extremity edema dementia COPD presently not on any medication. 4. History of hypertension presently not on any medication closely follow blood pressure trends. 5. There are sclerotic changes seen in the CAT scan concerning for metastatic disease.  Which will need further work-up as warranted by the  family.   DVT prophylaxis: Lovenox. Code Status: DNR. Family Communication: Patient's daughter. Disposition Plan: To be determined. Consults called: Physical therapy Education officer, museum. Admission status: Observation.   Rise Patience MD [facility] Pager [phone].  If 7PM-7AM, please contact night-coverage [email or web address] Password [facility]  01/19/2019, 8:28 PM

## 2019-01-19 NOTE — ED Provider Notes (Signed)
[facility] EMERGENCY DEPARTMENT Provider Note   CSN: [id number] Arrival date & time: 01/19/19  1526    History   Chief Complaint Chief Complaint  Patient presents with   Altered Mental Status    HPI Chelcea Zahn is a 85 y.o. female with PMH/o COPD, GERD, elevated hemidiaphragm BIB EMS for evaluation of altered mental status.  Per patient's daughter, patient had been living with her the last 3 months due to COVID-19.  She had previously been living in the independent living at [facility].  They decided to move her back yesterday and reports that was the last time that they saw her.  Daughter reports that she called patient today and stated that patient was having a hard time carrying on a conversation.  Daughter stated that she kept getting distracted and was talking about "everything being wet."  Daughter reports that she did have some slurred speech but daughter states that patient will intermittently have some slurred speech issues.  Daughter went to go check on patient and found her sitting on the bathroom.  She states that she had 2 depends on and had urinated over herself.  Daughter states that patient was too weak to get up from the bathroom floor.  Patient states that she did not fall but that she just sat on the floor.  Daughter states that her and her husband had to help patient up.  They states that this is abnormal for her.  Daughter states that patient has history of frequent falls but is able to get herself up.  They noticed that she felt weak and noted a little bit more unsteady on her feet once they got her up.  Additionally, daughter stated that she was concerned because patient seemed confused.  She states she was having trouble putting her pants on and was confused as to why she had to depend on.  Patient is not on blood thinners.  At baseline, patient is able to ambulate with the assistance of a walker.  Patient denies any symptoms at this time.  She denies  any vision changes, neck pain, chest pain, difficulty breathing, abdominal pain, numbness/weakness of arms or legs, nausea/vomiting.  Of note, daughter reports that patient was recently treated for UTI in May.  She states that patient did finish a course of antibiotics.     The history is provided by the patient.    Past Medical History:  Diagnosis Date   Abrasion of cornea, left    Anal ulcer    Anxiety    Aphthous ulcer    Carotid artery stenosis    COPD (chronic obstructive pulmonary disease) (HCC)    Elevated hemidiaphragm    Left   Fracture of femur, shaft (HCC)    closed, Right   GERD (gastroesophageal reflux disease)    Hyperkalemia    Incontinence of urine    urge   Osteoarthritis    Osteoporosis    Peripheral neuropathy     Patient Active Problem List   Diagnosis Date Noted   Near syncope 01/19/2019   Hypokalemia 01/19/2019   Dementia ([place]) 01/19/2019   Altered mental status 10/18/2011   Constipation 10/18/2011   Fracture lumbar vertebra-closed ([place]) 10/18/2011   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 Ninfa Linden   neuroplasty decompression median nerve at carpal tunnel       OB History   No obstetric history on file.      Home Medications    Prior to Admission medications   Medication Sig Start Date End Date Taking? Authorizing Provider  OXYGEN Inhale 3 L into the lungs.   Yes [provider]  sorbitol 70 % SOLN Take 20 mLs by mouth daily as needed (CONSTIPATION). Patient not taking: Reported on 01/19/2019 10/27/11   Mendel Corning, MD    Family History Family History  Problem Relation Age of Onset   Alzheimer's disease Other    Diabetes type II Other    Hypertension Other    Mental retardation Other    Osteoporosis Other    Parkinsonism Other    Cancer Other    Heart disease Other     Social History Social History   Tobacco Use   Smoking status: Never Smoker   Smokeless tobacco: Never Used  Substance Use Topics   Alcohol use: No   Drug use: No     Allergies   Tape   Review of Systems Review of Systems  Constitutional: Negative for fever.  Respiratory: Negative for cough and shortness of breath.   Cardiovascular: Negative for chest pain.  Gastrointestinal: Negative for abdominal pain, nausea and vomiting.  Genitourinary: Negative for dysuria and hematuria.  Musculoskeletal: Positive for gait problem.  Neurological: Positive for weakness (generalized). Negative for numbness and headaches.  Psychiatric/Behavioral: Positive for confusion.  All other systems reviewed and are negative.    Physical Exam Updated Vital Signs BP (!) 177/84 (BP Location: Right Arm)    Pulse (!) 109    Temp 97.9 F (36.6 C) (Oral)    Resp 17    Ht 4\' 9"  (1.448 m)    Wt 43 kg    SpO2 96%    BMI 20.51 kg/m   Physical Exam Vitals signs and nursing note reviewed.  Constitutional:      Appearance: Normal appearance. She is well-developed.  HENT:     Head: Normocephalic and atraumatic.     Comments: No tenderness to palpation of skull. No deformities or crepitus noted. No open wounds, abrasions or lacerations.  Eyes:     General: Lids are normal.     Conjunctiva/sclera: Conjunctivae normal.     Pupils: Pupils are equal, round, and reactive to light.     Comments: PERRL. EOMs intact without difficulty.   Neck:     Musculoskeletal: Full passive range of motion without pain.     Comments: C collar applied. No midline C spine tenderness.  Cardiovascular:     Rate and Rhythm: Normal rate and  regular rhythm.     Pulses: Normal pulses.          Radial pulses are 2+ on the right side and 2+ on the left side.       Dorsalis pedis pulses are 2+ on the right side and 2+ on the left side.     Heart sounds: Normal heart sounds. No murmur. No friction rub. No gallop.   Pulmonary:     Effort: Pulmonary effort is normal.     Breath sounds:  Normal breath sounds.     Comments: Lungs clear to auscultation bilaterally.  Symmetric chest rise.  No wheezing, rales, rhonchi. Patient on 2L O2 at baseline.  Abdominal:     Palpations: Abdomen is soft. Abdomen is not rigid.     Tenderness: There is no abdominal tenderness. There is no guarding.     Comments: Abdomen is soft, non-distended, non-tender. No rigidity, No guarding. No peritoneal signs.  Musculoskeletal: Normal range of motion.     Comments: No tenderness to palpation to bilateral shoulders, clavicles, elbows, and wrists. No deformities or crepitus noted. FROM of BUE without difficulty.  No tenderness to palpation to bilateral knees and ankles. No deformities or crepitus noted. FROM of BLE without any difficulty.  Bilateral lower extremities with 1+ pitting edema noted from mid tib-fib down distally.  There is some overlying erythema but no warmth.  Bilateral lower extremities are symmetric in appearance.  Skin:    General: Skin is warm and dry.     Capillary Refill: Capillary refill takes less than 2 seconds.  Neurological:     Mental Status: She is alert and oriented to person, place, and time.     Comments: A&O x3 Cranial nerves III-XII intact Follows commands, Moves all extremities  5/5 strength to BUE and BLE  Sensation intact throughout all major nerve distributions Normal coordination No slurred speech. No facial droop.   Psychiatric:        Speech: Speech normal.      ED Treatments / Results  Labs (all labs ordered are listed, but only abnormal results are displayed) Labs Reviewed  COMPREHENSIVE METABOLIC PANEL -  Abnormal; Notable for the following components:      Result Value   Potassium 2.6 (*)    Glucose, Bld 115 (*)    BUN 25 (*)    Calcium 8.7 (*)    Total Protein 6.3 (*)    All other components within normal limits  URINALYSIS, ROUTINE W REFLEX MICROSCOPIC - Abnormal; Notable for the following components:   APPearance HAZY (*)    Ketones, ur 20 (*)    Protein, ur 30 (*)    Bacteria, UA RARE (*)    All other components within normal limits  CK TOTAL AND CKMB (NOT AT [facility]) - Abnormal; Notable for the following components:   Total CK 428 (*)    CK, MB 13.5 (*)    Relative Index 3.2 (*)    All other components within normal limits  SARS CORONAVIRUS 2 (HOSPITAL ORDER, PERFORMED IN [facility] [facility])  CBC WITH DIFFERENTIAL/PLATELET  BASIC METABOLIC PANEL  CBC  MAGNESIUM  TROPONIN I  TROPONIN I  TROPONIN I    EKG EKG Interpretation  Date/Time:  Friday January 19 2019 16:01:59 EDT Ventricular Rate:  96 PR Interval:    QRS Duration: 94 QT Interval:  382 QTC Calculation: 483 R Axis:   53 Text Interpretation:  Sinus rhythm Ventricular premature complex Abnormal R-wave progression, early transition Borderline T wave abnormalities No significant change since last tracing Confirmed by Dorie Rank [phone]) on 01/19/2019 4:04:25 PM   Radiology Ct Head Wo Contrast  Result Date: 01/19/2019 CLINICAL DATA:  Altered mental status.  Patient found down. EXAM: CT HEAD WITHOUT CONTRAST CT CERVICAL SPINE WITHOUT CONTRAST TECHNIQUE: Multidetector CT imaging of the head and cervical spine was performed following the standard protocol without intravenous contrast. Multiplanar CT image reconstructions of the cervical spine were also generated. COMPARISON:  Head CT 12/04/2018 FINDINGS: CT HEAD FINDINGS  Brain: Stable age related cerebral atrophy, ventriculomegaly and periventricular white matter disease. No extra-axial fluid collections are identified. No CT findings for acute hemispheric infarction or  intracranial hemorrhage. No mass lesions. The brainstem and cerebellum are normal. Vascular: Stable vascular calcifications. No definite aneurysm or hyperdense vessels. Skull: No skull fracture or bone lesions. Sinuses/Orbits: Right mastoid effusions are noted. The left mastoid air cells are clear. The paranasal sinuses are clear. Other: No scalp lesions or scalp hematoma. CT CERVICAL SPINE FINDINGS Alignment: Grossly normal overall alignment. There is degenerative cervical spondylosis with multilevel disc disease and facet disease and multilevel degenerative subluxations. Skull base and vertebrae: Advanced degenerative changes at C1-2 with pannus formation. There are compression deformities of T1 and T3 with underlying findings suspicious for mixed lytic and sclerotic bone disease. Other sclerotic lesions are also noted and findings suspicious for metastatic disease. Does this patient have known primary? I do not have any prior studies for comparison but I suspect the compression fractures are remote. I do not see any obvious paraspinal tumor or hematoma. Soft tissues and spinal canal: No abnormal prevertebral soft tissue swelling. No obvious spinal hematoma. Disc levels: The spinal canal is fairly generous. No significant spinal stenosis. There is mild retropulsion of the T3 vertebral body noted. Upper chest: The lung apices are grossly clear. Other: Vascular calcifications but no neck mass or adenopathy. IMPRESSION: 1. Stable age related cerebral atrophy, ventriculomegaly and periventricular white matter disease. No acute intracranial findings or mass lesions. 2. Advanced degenerative cervical spondylosis with multilevel disc disease and facet disease. 3. Compression deformities of T1 and T3 of uncertain age. Mild retropulsion at T3. 4. Sclerotic changes suspicious for metastatic disease. Recommend clinical correlation. Electronically Signed   By: Marijo Sanes M.D.   On: 01/19/2019 18:01   Ct Cervical Spine Wo  Contrast  Result Date: 01/19/2019 CLINICAL DATA:  Altered mental status.  Patient found down. EXAM: CT HEAD WITHOUT CONTRAST CT CERVICAL SPINE WITHOUT CONTRAST TECHNIQUE: Multidetector CT imaging of the head and cervical spine was performed following the standard protocol without intravenous contrast. Multiplanar CT image reconstructions of the cervical spine were also generated. COMPARISON:  Head CT 12/04/2018 FINDINGS: CT HEAD FINDINGS Brain: Stable age related cerebral atrophy, ventriculomegaly and periventricular white matter disease. No extra-axial fluid collections are identified. No CT findings for acute hemispheric infarction or intracranial hemorrhage. No mass lesions. The brainstem and cerebellum are normal. Vascular: Stable vascular calcifications. No definite aneurysm or hyperdense vessels. Skull: No skull fracture or bone lesions. Sinuses/Orbits: Right mastoid effusions are noted. The left mastoid air cells are clear. The paranasal sinuses are clear. Other: No scalp lesions or scalp hematoma. CT CERVICAL SPINE FINDINGS Alignment: Grossly normal overall alignment. There is degenerative cervical spondylosis with multilevel disc disease and facet disease and multilevel degenerative subluxations. Skull base and vertebrae: Advanced degenerative changes at C1-2 with pannus formation. There are compression deformities of T1 and T3 with underlying findings suspicious for mixed lytic and sclerotic bone disease. Other sclerotic lesions are also noted and findings suspicious for metastatic disease. Does this patient have known primary? I do not have any prior studies for comparison but I suspect the compression fractures are remote. I do not see any obvious paraspinal tumor or hematoma. Soft tissues and spinal canal: No abnormal prevertebral soft tissue swelling. No obvious spinal hematoma. Disc levels: The spinal canal is fairly generous. No significant spinal stenosis. There is mild retropulsion of the T3  vertebral body noted. Upper chest: The lung apices are  grossly clear. Other: Vascular calcifications but no neck mass or adenopathy. IMPRESSION: 1. Stable age related cerebral atrophy, ventriculomegaly and periventricular white matter disease. No acute intracranial findings or mass lesions. 2. Advanced degenerative cervical spondylosis with multilevel disc disease and facet disease. 3. Compression deformities of T1 and T3 of uncertain age. Mild retropulsion at T3. 4. Sclerotic changes suspicious for metastatic disease. Recommend clinical correlation. Electronically Signed   By: Marijo Sanes M.D.   On: 01/19/2019 18:01    Procedures Procedures (including critical care time)  Medications Ordered in ED Medications  acetaminophen (TYLENOL) tablet 650 mg (has no administration in time range)    Or  acetaminophen (TYLENOL) suppository 650 mg (has no administration in time range)  ondansetron (ZOFRAN) tablet 4 mg (has no administration in time range)    Or  ondansetron (ZOFRAN) injection 4 mg (has no administration in time range)  enoxaparin (LOVENOX) injection 40 mg (40 mg Subcutaneous Given 01/19/19 2252)  0.9 %  sodium chloride infusion ( Intravenous New Bag/Given 01/19/19 2253)  sodium chloride 0.9 % bolus 1,000 mL (0 mLs Intravenous Stopped 01/19/19 1813)  potassium chloride 10 mEq in 100 mL IVPB (0 mEq Intravenous Stopped 01/19/19 1920)  potassium chloride SA (K-DUR) CR tablet 40 mEq (40 mEq Oral Given 01/19/19 1813)  sodium chloride 0.9 % bolus 500 mL (0 mLs Intravenous Stopped 01/19/19 2059)     Initial Impression / Assessment and Plan / ED Course  I have reviewed the triage vital signs and the nursing notes.  Pertinent labs & imaging results that were available during my care of the patient were reviewed by me and considered in my medical decision making (see chart for details).         85 year old female who presents for evaluation of generalized weakness, confusion.  Daughter reports  that she was recently staying with her until yesterday where she went back to independent living.  Found on the floor today.  Daughter states that she was acting more confused than normal, prompting ED visit.  Patient denies any complaints at this time.  She is alert and oriented x3 and is able to answer questions appropriately.  Check imaging, labs, UA. Question syncope vs fall.   CMP shows potassium of 2.6. Plan for repletion. CBC without any significant leukocytosis or anemia.  UA negative for infectious etiology.  It does show ketones a question of dehydration.  CT head and neck show stable age-related cerebral atrophy.  No acute intracranial findings.  There is advanced degenerative cervical spondylosis with multilevel disc disease.  There is mention of compression deformities of T1 and T3 of uncertain age that they suspect that this is nonacute.  Sclerotic changes suspicious for metastatic disease.  Patient with hypokalemia, questionable fall versus syncope as well as generalized weakness.  At this time, I feel that patient would need admission for repletion of potassium.  Discussed patient with Dr. Hal Hope (hospitalist). Will admit.   Portions of this note were generated with [employer]. Dictation errors may occur despite best attempts at proofreading.   Final Clinical Impressions(s) / ED Diagnoses   Final diagnoses:  Hypokalemia  Generalized weakness    ED Discharge Orders    None       Desma Mcgregor 01/19/19 2254    Dorie Rank, MD 01/20/19 1549

## 2019-01-20 ENCOUNTER — Observation Stay (HOSPITAL_COMMUNITY): Payer: Medicare Other

## 2019-01-20 DIAGNOSIS — Z820 Family history of epilepsy and other diseases of the nervous system: Secondary | ICD-10-CM | POA: Diagnosis not present

## 2019-01-20 DIAGNOSIS — C799 Secondary malignant neoplasm of unspecified site: Secondary | ICD-10-CM | POA: Diagnosis present

## 2019-01-20 DIAGNOSIS — M810 Age-related osteoporosis without current pathological fracture: Secondary | ICD-10-CM | POA: Diagnosis present

## 2019-01-20 DIAGNOSIS — Z9181 History of falling: Secondary | ICD-10-CM | POA: Diagnosis not present

## 2019-01-20 DIAGNOSIS — K219 Gastro-esophageal reflux disease without esophagitis: Secondary | ICD-10-CM | POA: Diagnosis present

## 2019-01-20 DIAGNOSIS — R531 Weakness: Secondary | ICD-10-CM | POA: Diagnosis not present

## 2019-01-20 DIAGNOSIS — Z87440 Personal history of urinary (tract) infections: Secondary | ICD-10-CM | POA: Diagnosis not present

## 2019-01-20 DIAGNOSIS — F419 Anxiety disorder, unspecified: Secondary | ICD-10-CM | POA: Diagnosis present

## 2019-01-20 DIAGNOSIS — F0390 Unspecified dementia without behavioral disturbance: Secondary | ICD-10-CM | POA: Diagnosis present

## 2019-01-20 DIAGNOSIS — R4182 Altered mental status, unspecified: Secondary | ICD-10-CM | POA: Diagnosis present

## 2019-01-20 DIAGNOSIS — Z833 Family history of diabetes mellitus: Secondary | ICD-10-CM | POA: Diagnosis not present

## 2019-01-20 DIAGNOSIS — R600 Localized edema: Secondary | ICD-10-CM | POA: Diagnosis present

## 2019-01-20 DIAGNOSIS — R296 Repeated falls: Secondary | ICD-10-CM | POA: Diagnosis present

## 2019-01-20 DIAGNOSIS — R339 Retention of urine, unspecified: Secondary | ICD-10-CM | POA: Diagnosis present

## 2019-01-20 DIAGNOSIS — I10 Essential (primary) hypertension: Secondary | ICD-10-CM | POA: Diagnosis present

## 2019-01-20 DIAGNOSIS — Z79899 Other long term (current) drug therapy: Secondary | ICD-10-CM | POA: Diagnosis not present

## 2019-01-20 DIAGNOSIS — Z1159 Encounter for screening for other viral diseases: Secondary | ICD-10-CM | POA: Diagnosis not present

## 2019-01-20 DIAGNOSIS — W19XXXS Unspecified fall, sequela: Secondary | ICD-10-CM | POA: Diagnosis not present

## 2019-01-20 DIAGNOSIS — Z9114 Patient's other noncompliance with medication regimen: Secondary | ICD-10-CM | POA: Diagnosis not present

## 2019-01-20 DIAGNOSIS — E876 Hypokalemia: Secondary | ICD-10-CM | POA: Diagnosis present

## 2019-01-20 DIAGNOSIS — Z7982 Long term (current) use of aspirin: Secondary | ICD-10-CM | POA: Diagnosis not present

## 2019-01-20 DIAGNOSIS — Z8249 Family history of ischemic heart disease and other diseases of the circulatory system: Secondary | ICD-10-CM | POA: Diagnosis not present

## 2019-01-20 DIAGNOSIS — Z8262 Family history of osteoporosis: Secondary | ICD-10-CM | POA: Diagnosis not present

## 2019-01-20 DIAGNOSIS — J449 Chronic obstructive pulmonary disease, unspecified: Secondary | ICD-10-CM | POA: Diagnosis present

## 2019-01-20 DIAGNOSIS — R55 Syncope and collapse: Secondary | ICD-10-CM | POA: Diagnosis present

## 2019-01-20 DIAGNOSIS — Z66 Do not resuscitate: Secondary | ICD-10-CM | POA: Diagnosis present

## 2019-01-20 DIAGNOSIS — Z888 Allergy status to other drugs, medicaments and biological substances status: Secondary | ICD-10-CM | POA: Diagnosis not present

## 2019-01-20 LAB — CBC
HCT: 38.1 % (ref 36.0–46.0)
Hemoglobin: 11.9 g/dL — ABNORMAL LOW (ref 12.0–15.0)
MCH: 27.9 pg (ref 26.0–34.0)
MCHC: 31.2 g/dL (ref 30.0–36.0)
MCV: 89.4 fL (ref 80.0–100.0)
Platelets: 253 10*3/uL (ref 150–400)
RBC: 4.26 MIL/uL (ref 3.87–5.11)
RDW: 14.1 % (ref 11.5–15.5)
WBC: 9.2 10*3/uL (ref 4.0–10.5)
nRBC: 0.2 % (ref 0.0–0.2)

## 2019-01-20 LAB — BASIC METABOLIC PANEL
Anion gap: 14 (ref 5–15)
BUN: 21 mg/dL (ref 8–23)
CO2: 26 mmol/L (ref 22–32)
Calcium: 7.8 mg/dL — ABNORMAL LOW (ref 8.9–10.3)
Chloride: 102 mmol/L (ref 98–111)
Creatinine, Ser: 0.71 mg/dL (ref 0.44–1.00)
GFR calc Af Amer: >60 mL/min (ref >60)
GFR calc non Af Amer: >60 mL/min (ref >60)
Glucose, Bld: 74 mg/dL (ref 70–99)
Potassium: 2.9 mmol/L — ABNORMAL LOW (ref 3.5–5.1)
Sodium: 142 mmol/L (ref 135–145)

## 2019-01-20 LAB — TROPONIN I
Troponin I: 0.03 ng/mL (ref <0.03)
Troponin I: 0.03 ng/mL (ref <0.03)
Troponin I: <0.03 ng/mL (ref <0.03)

## 2019-01-20 LAB — MRSA PCR SCREENING: MRSA by PCR: NEGATIVE

## 2019-01-20 LAB — MAGNESIUM: Magnesium: 1.8 mg/dL (ref 1.7–2.4)

## 2019-01-20 MED ORDER — MAGNESIUM SULFATE 2 GM/50ML IV SOLN
2.0000 g | Freq: Once | INTRAVENOUS | Status: AC
  Administered 2019-01-20: 2 g via INTRAVENOUS
  Filled 2019-01-20: qty 50

## 2019-01-20 MED ORDER — POTASSIUM CHLORIDE CRYS ER 20 MEQ PO TBCR
40.0000 meq | EXTENDED_RELEASE_TABLET | Freq: Once | ORAL | Status: AC
  Administered 2019-01-20: 40 meq via ORAL
  Filled 2019-01-20: qty 2

## 2019-01-20 MED ORDER — POTASSIUM CHLORIDE CRYS ER 20 MEQ PO TBCR
40.0000 meq | EXTENDED_RELEASE_TABLET | Freq: Once | ORAL | Status: AC
  Administered 2019-01-20: 11:00:00 40 meq via ORAL
  Filled 2019-01-20: qty 2

## 2019-01-20 NOTE — NC FL2 (Signed)
[place] LEVEL OF CARE SCREENING TOOL     IDENTIFICATION  Patient Name: Beverly Adkins Birthdate: 07-Jan-1925 Sex: female Admission Date (Current Location): 01/19/2019  [facility] and [place] Number:  Herbalist and Address:  [facility] [place] [address], [place], [place] [address]      Provider Number: [id number]  Attending Physician Name and Address:  Geradine Girt, DO  Relative Name and Phone Number:  Elyse Jarvis, [phone]    Current Level of Care: Hospital Recommended Level of Care: [place] Prior Approval Number:    Date Approved/Denied: 08/08/10 PASRR Number: [id number]  Discharge Plan: SNF    Current Diagnoses: Patient Active Problem List   Diagnosis Date Noted  . Near syncope 01/19/2019  . Hypokalemia 01/19/2019  . Dementia ([employer]) 01/19/2019  . Altered mental status 10/18/2011  . Constipation 10/18/2011  . Fracture lumbar vertebra-closed ([place]) 10/18/2011  . 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 Elevated hemidiaphragm   . Anxiety   . Abrasion of cornea, left   . Aphthous ulcer   . Carotid artery stenosis   . GERD (gastroesophageal reflux disease)   . Hyperkalemia   . Osteoporosis   . Peripheral neuropathy   . Incontinence of urine     Orientation RESPIRATION BLADDER Height & Weight     Self, Place  Normal Incontinent, External catheter Weight: 94 lb 12.8 oz (43 kg) Height:  4\' 9"  (144.8 cm)  BEHAVIORAL SYMPTOMS/MOOD NEUROLOGICAL BOWEL NUTRITION STATUS      Incontinent Diet(Heart Healthy, thin liquids)  AMBULATORY STATUS COMMUNICATION OF NEEDS Skin   Extensive Assist Verbally Normal, Other (Comment)(Weeping on leg/ankle)                       Personal Care Assistance Level of Assistance  Bathing, Dressing, Feeding, Total care Bathing Assistance: Maximum assistance Feeding assistance: Independent Dressing Assistance: Maximum assistance Total Care Assistance:  Limited assistance   Functional Limitations Info  Sight, Hearing, Speech Sight Info: Adequate Hearing Info: Adequate Speech Info: Adequate    SPECIAL CARE FACTORS FREQUENCY  PT (By licensed PT), OT (By licensed OT)     PT Frequency: 5x/wk OT Frequency: 5x/wk            Contractures Contractures Info: Not present    Additional Factors Info  Code Status, Allergies Code Status Info: DNR Allergies Info: Tape           Current Medications (01/20/2019):  This is the current hospital active medication list Current Facility-Administered Medications  Medication Dose Route Frequency Provider Last Rate Last Dose  . acetaminophen (TYLENOL) tablet 650 mg  650 mg Oral Q6H PRN Rise Patience, MD       Or  . acetaminophen (TYLENOL) suppository 650 mg  650 mg Rectal Q6H PRN Rise Patience, MD      . enoxaparin (LOVENOX) injection 40 mg  40 mg Subcutaneous Q24H Rise Patience, MD   40 mg at 01/19/19 2252  . ondansetron (ZOFRAN) tablet 4 mg  4 mg Oral Q6H PRN Rise Patience, MD       Or  . ondansetron [facility]) injection 4 mg  4 mg Intravenous Q6H PRN Rise Patience, MD         Discharge Medications: Please see discharge summary for a list of discharge medications.  Relevant Imaging Results:  Relevant Lab Results:   Additional Information SSN: [id number]  Philippa Chester Jaelon Gatley, LCSWA

## 2019-01-20 NOTE — Evaluation (Signed)
Physical Therapy Evaluation Patient Details Name: Beverly Adkins MRN: [id number] DOB: May 30, 1925 Today's Date: 01/20/2019   History of Present Illness  85 y.o. female with history of dementia, hypertension, chronic lower extremity edema COPD who has not been taking any of her medications was brought to the ER after patient was found on the floor in the bathroom of her living facility.  MRI revealed No acute infarction, hemorrhage, hydrocephalus, extra-axial collection or mass lesion. Pelvic CT revealed no acute fx. Labs revealed severe hypokalemia of 2.6.  Troponin 0.03 CK 428. Admitted for observation 01/19/19  Clinical Impression  Pt found laying in bed with cervical collar placed. Pt oriented to person and place, unable to give clear description of why she is here. When asked if she fell she said "No, I was helping the 2 others on the floor." Pt unable to eloborate. Pt reports living in independent living townhouse, then assisted living apartment. Reports varying levels of assist needed. Pt requires modA for rolling to L and max A for coming to upright. Initially able to statically sit on side of the bed with knees flexed at soles of feet flat on the floor. Pt became incontinent of urine attempted to come to standing in RW for pericare however pt unable to sequence events, sliding hips almost off EOB. Despite maximal verbal cuing and blocking of bilateral knees. Pt unable to flex knees to stop slide. Required calling NT for total Ax2 back to supine to keep from sliding off EoB. PT recommends SNF level rehab at discharge to improve sequencing, strength and balance. PT will continue to follow acutely.     Follow Up Recommendations SNF    Equipment Recommendations  Other (comment)(TBD at next venue)    Recommendations for Other Services       Precautions / Restrictions Precautions Precautions: Fall Precaution Comments: fall prior to admit Restrictions Weight Bearing Restrictions: No Other  Position/Activity Restrictions: pt has cervical collar, no order, CT clear however left on in an abundance of caution       Mobility  Bed Mobility Overal bed mobility: Needs Assistance Bed Mobility: Supine to Sit;Sit to Supine;Rolling Rolling: Mod assist   Supine to sit: Max assist Sit to supine: Total assist;+2 for physical assistance   General bed mobility comments: due to placement of cervical collar, operated under abundance of caution, log rolled onto R side with mod A, max A for LE management off bed and trunk to upright, with hips too far forward on EoB pt unable to assist in getting back in bed  Transfers Overall transfer level: Needs assistance               General transfer comment: unable to perform         Balance Overall balance assessment: Needs assistance Sitting-balance support: Feet supported;Bilateral upper extremity supported Sitting balance-Leahy Scale: Zero Sitting balance - Comments: initially able to statically sit on own however after about 2 minutes pt with increased posterior lean and extended posture, requiring blocking of LE to keep from sliding out of bed                                     Pertinent Vitals/Pain Pain Assessment: No/denies pain    Home Living Family/patient expects to be discharged to:: Assisted living(pt reports but also reports independent living townhouse) Living Arrangements: Alone  Prior Function           Comments: unable to obtain clear level of function from patient, reports walking with RW, and not requiring assist for bathing and dressing, reports no having meals served, then yes meals served        Extremity/Trunk Assessment   Upper Extremity Assessment Upper Extremity Assessment: Generalized weakness;Difficult to assess due to impaired cognition    Lower Extremity Assessment Lower Extremity Assessment: Generalized weakness;RLE deficits/detail;LLE  deficits/detail;Difficult to assess due to impaired cognition RLE Deficits / Details: increased LE edema, decreased ability to flex knees on command,  LLE Deficits / Details: increased LE edema, decreased ability to flex knees on command,        Communication   Communication: No difficulties  Cognition Arousal/Alertness: Awake/alert Behavior During Therapy: WFL for tasks assessed/performed Overall Cognitive Status: Impaired/Different from baseline Area of Impairment: Orientation;Memory;Following commands;Safety/judgement;Awareness;Problem solving                 Orientation Level: Person;Place   Memory: Decreased short-term memory(doesn't know why she is here) Following Commands: Follows one step commands with increased time;Follows one step commands consistently;Follows multi-step commands inconsistently Safety/Judgement: Decreased awareness of safety Awareness: Emergent Problem Solving: Slow processing;Decreased initiation;Difficulty sequencing;Requires verbal cues;Requires tactile cues General Comments: reports that she was sitting on floor in bathroom to help 2 others when they brought her to hospital, decreased awareness of sliding out of bed while sitting EoB, unable to follow commands to flex knees to block forward momentum, however previously able to flex knees during sitting on bed      General Comments General comments (skin integrity, edema, etc.): noted small wound on sacral area with rolling        Assessment/Plan    PT Assessment Patient needs continued PT services  PT Problem List Decreased strength;Decreased range of motion;Decreased activity tolerance;Decreased balance;Decreased mobility;Decreased cognition;Decreased coordination;Decreased safety awareness;Decreased knowledge of use of DME;Decreased skin integrity       PT Treatment Interventions DME instruction;Gait training;Functional mobility training;Therapeutic activities;Therapeutic exercise;Balance  training;Patient/family education;Cognitive remediation    PT Goals (Current goals can be found in the Care Plan section)  Acute Rehab PT Goals Patient Stated Goal: none stated PT Goal Formulation: Patient unable to participate in goal setting Time For Goal Achievement: 02/03/19 Potential to Achieve Goals: Fair    Frequency Min 2X/week    AM-PAC PT "6 Clicks" Mobility  Outcome Measure Help needed turning from your back to your side while in a flat bed without using bedrails?: A Lot Help needed moving from lying on your back to sitting on the side of a flat bed without using bedrails?: Total Help needed moving to and from a bed to a chair (including a wheelchair)?: Total Help needed standing up from a chair using your arms (e.g., wheelchair or bedside chair)?: Total Help needed to walk in hospital room?: Total Help needed climbing 3-5 steps with a railing? : Total 6 Click Score: 7    End of Session Equipment Utilized During Treatment: Gait belt Activity Tolerance: Patient tolerated treatment well Patient left: in bed;with call bell/phone within reach;with bed alarm set Nurse Communication: Mobility status(need for assessment of sacral wound) PT Visit Diagnosis: Unsteadiness on feet (R26.81);Other abnormalities of gait and mobility (R26.89);Muscle weakness (generalized) (M62.81);History of falling (Z91.81);Difficulty in walking, not elsewhere classified (R26.2)    Time: 5465-6812 PT Time Calculation (min) (ACUTE ONLY): 25 min   Charges:     PT Treatments $Therapeutic Activity: 8-22 mins  Dorothymae Maciver B. Migdalia Dk PT, DPT Acute Rehabilitation Services Pager [phone] Office [phone]   [place] 01/20/2019, 10:29 AM

## 2019-01-20 NOTE — TOC Initial Note (Signed)
Transition of Care [facility]) - Initial/Assessment Note    Patient Details  Name: Beverly Adkins MRN: [id number] Date of Birth: 1924/10/16  Transition of [facility]) CM/SW Contact:    Gelene Mink, [place] Phone Number: 01/20/2019, 4:35 PM  Clinical Narrative:                  CSW called and spoke with the patient's daughter. The patient was living with her daughter during [place]. Before she stayed with her daughter she stayed at the [facility], [place]. Patient's daughter reported that they were paying her rent while the patient lived with her. CSW inquired about the patient continuing to stay with her daughter, she stated that it wasn't a safe situation. She stated that she let her mom stay there because she did not want her to risk getting COVID but it was very crowded in her home. She is concerned about her mother going back to [facility] because she does not feel that her mother is safe to stay there. The patient's daughter stated that she can see her mother starting to decline and the dementia is getting worse.   CSW inquired about SNF. She stated that her mother has been to SNF in the past. She stated that she has stayed at [facility] and they enjoyed the care she received. Other options they are open to are [facility] or [facility] in [place]. The patient's daughter would prefer a SNF close to her. CSW obtained permission to fax the patient out to listed facilities. CSW sent the patient's daughter the CMS SNF list by email. Patient's daughter will contact CSW with other facility choices.   CSW had a conversation with the patient's daughter about possible options. CSW and the patient's daughter spoke about LTC and ALF's. CSW gave the option of applying for medicaid to help cover the cost. 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    CSW will continue to follow.   Expected Discharge Plan:  Skilled Nursing Facility Barriers to Discharge: Continued Medical Work up   Patient Goals and CMS Choice Patient states their goals for this hospitalization and ongoing recovery are:: Pt daughter is agreeable to her mother going to rehab CMS Medicare.gov Compare Post Acute Care list provided to:: Patient Represenative (must comment) Choice offered to / list presented to : Adult Children  Expected Discharge Plan and Services Expected Discharge Plan: [place] In-house Referral: Clinical Social Work Discharge Planning Services: NA Post Acute Care Choice: [place] Living arrangements for the past 2 months: [place]                 DME Arranged: N/A DME Agency: NA       HH Arranged: NA [place] Agency: NA        Prior Living Arrangements/Services Living arrangements for the past 2 months: [place] Lives with:: Facility Resident Patient language and need for interpreter reviewed:: No Do you feel safe going back to the place where you live?: No   Pt daughter doesn't feel comfortable for her mother going back. She doesn't feel comfortable having her mother live alone anymore.  Need for Family Participation in Patient Care: Yes (Comment) Care giver support system in place?: Yes (comment)   Criminal Activity/Legal Involvement Pertinent to Current Situation/Hospitalization: No - Comment as needed  Activities of Daily Living Home Assistive Devices/Equipment: Gilford Rile (specify  type), Oxygen, Eyeglasses ADL Screening (condition at time of admission) Patient's cognitive ability adequate to safely complete daily activities?: No Is the patient deaf or have difficulty hearing?: Yes Does the patient have difficulty seeing, even when wearing glasses/contacts?: No Does the patient have difficulty concentrating, remembering, or making decisions?: Yes Patient able to express need for assistance with ADLs?: No Does the patient have  difficulty dressing or bathing?: Yes Independently performs ADLs?: No Communication: Independent Dressing (OT): Needs assistance Is this a change from baseline?: Pre-admission baseline Grooming: Needs assistance Is this a change from baseline?: Pre-admission baseline Feeding: Independent Bathing: Needs assistance Is this a change from baseline?: Pre-admission baseline Toileting: Independent with device (comment) In/Out Bed: Independent with device (comment) Walks in Home: Independent with device (comment) Does the patient have difficulty walking or climbing stairs?: Yes Weakness of Legs: Both Weakness of Arms/Hands: Both  Permission Sought/Granted Permission sought to share information with : Case Manager Permission granted to share information with : Yes, Verbal Permission Granted  Share Information with NAME: Florentina Jenny  Permission granted to share info w AGENCY: All SNF  Permission granted to share info w Relationship: Daughter     Emotional Assessment Appearance:: Appears stated age Attitude/Demeanor/Rapport: Unable to Assess Affect (typically observed): Unable to Assess Orientation: : Oriented to Self, Oriented to Place Alcohol / Substance Use: Not Applicable Psych Involvement: No (comment)  Admission diagnosis:  Hypokalemia [E87.6] Generalized weakness [R53.1] Near syncope [R55] Patient Active Problem List   Diagnosis Date Noted  . Near syncope 01/19/2019  . Hypokalemia 01/19/2019  . Dementia ([place]) 01/19/2019  . Altered mental status 10/18/2011  . Constipation 10/18/2011  . Fracture lumbar vertebra-closed ([place]) 10/18/2011  . 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 Elevated hemidiaphragm   . Anxiety   . Abrasion of cornea, left   . Aphthous ulcer   . Carotid artery stenosis   . GERD (gastroesophageal reflux disease)   . Hyperkalemia   . Osteoporosis   . Peripheral neuropathy   . Incontinence of urine    PCP:  Shawna Clamp, MD Pharmacy:   [facility], [address] [place] [address] Phone: [phone] Fax: [phone]     Social Determinants of Health (SDOH) Interventions    Readmission Risk Interventions No flowsheet data found.

## 2019-01-20 NOTE — Progress Notes (Signed)
Progress Note    Beverly Adkins  JJO:[id number] DOB: 28-Oct-1924  DOA: 01/19/2019 PCP: Shawna Clamp, MD    Brief Narrative:    Medical records reviewed and are as summarized below:  Beverly Adkins is an 85 y.o. female  with history of dementia, hypertension, chronic lower extremity edema COPD who has not been taking any of her medications was brought to the ER after patient was found on the floor in the bathroom of her living facility.  Per daughter patient has been living with her last 2 months since recent COVID pandemic and was placed back in the independent living  facility yesterday.  Family went to check on her today and was found on the floor confused more than normal.   Assessment/Plan:   Principal Problem:   Near syncope Active Problems:   Hypokalemia   Dementia (HCC)  AMS with dementia-- different from baseline, had a reported fall -MRI negative for acute events, chronic changes noted -recent UTI-- urinalysis unremarkable but will get culture to ensure resolution  -check x ray to r/o developing PNA -check B12/ammonia  Severe hypokalemia - cause not clear (no reported diarrhea-- not taking any medications at home) -replace Mg as well -AM labs pending  Chronic lower extremity edema  -appears at baseline  dementia  -previously was able to function in an ILF -not at baseline currently  History of hypertension  -not on any medications (patient's choice)  sclerotic changes seen in the CAT scan concerning for metastatic disease.   -discussed with family-- not keen on further work up which is reasonable   Family Communication/Anticipated D/C date and plan/Code Status   DVT prophylaxis: Lovenox ordered. Code Status: Full Code.  Family Communication: spoke with daughter Disposition Plan: from [place], will need higher level of care upon d/c   Medical Consultants:    None.     Subjective:   Sleepy-- no c/o pain, denies cough, denies fevers  Objective:     Vitals:   01/19/19 2148 01/20/19 0039 01/20/19 0454 01/20/19 0748  BP: (!) 177/84 (!) 174/76 (!) 163/80 (!) 158/77  Pulse: (!) 109 94 92 96  Resp: 17 16 16 14   Temp: 97.9 F (36.6 C) (!) 97.5 F (36.4 C) 98.2 F (36.8 C) 98.1 F (36.7 C)  TempSrc: Oral Oral Oral Oral  SpO2: 96% 98% 100% 98%  Weight:      Height:        Intake/Output Summary (Last 24 hours) at 01/20/2019 1056 Last data filed at 01/20/2019 0600 Gross per 24 hour  Intake 1866.98 ml  Output --  Net 1866.98 ml   Filed Weights   01/19/19 1547  Weight: 43 kg    Exam: In bed with cervical collar on above chin rrr Will awaken but falls back asleep quickly No wheezing B/l LE edema   Data Reviewed:   I have personally reviewed following labs and imaging studies:  Labs: Labs show the following:   Basic Metabolic Panel: Recent Labs  Lab 01/19/19 1638 01/19/19 2354  NA 139  --   K 2.6*  --   CL 98  --   CO2 30  --   GLUCOSE 115*  --   BUN 25*  --   CREATININE 0.66  --   CALCIUM 8.7*  --   MG  --  1.8   GFR Estimated Creatinine Clearance: 27.3 mL/min (by C-G formula based on SCr of 0.66 mg/dL). Liver Function Tests: Recent Labs  Lab 01/19/19 1638  AST  35  ALT 24  ALKPHOS 68  BILITOT 0.7  PROT 6.3*  ALBUMIN 3.6   No results for input(s): LIPASE, AMYLASE in the last 168 hours. No results for input(s): AMMONIA in the last 168 hours. Coagulation profile No results for input(s): INR, PROTIME in the last 168 hours.  CBC: Recent Labs  Lab 01/19/19 1638  WBC 7.9  NEUTROABS 6.6  HGB 12.7  HCT 40.0  MCV 88.9  PLT 233   Cardiac Enzymes: Recent Labs  Lab 01/19/19 1919 01/19/19 2354  CKTOTAL 428*  --   CKMB 13.5*  --   TROPONINI  --  0.03*   BNP (last 3 results) No results for input(s): PROBNP in the last 8760 hours. CBG: No results for input(s): GLUCAP in the last 168 hours. D-Dimer: No results for input(s): DDIMER in the last 72 hours. Hgb A1c: No results for input(s):  HGBA1C in the last 72 hours. Lipid Profile: No results for input(s): CHOL, HDL, LDLCALC, TRIG, CHOLHDL, LDLDIRECT in the last 72 hours. Thyroid function studies: No results for input(s): TSH, T4TOTAL, T3FREE, THYROIDAB in the last 72 hours.  Invalid input(s): FREET3 Anemia work up: No results for input(s): VITAMINB12, FOLATE, FERRITIN, TIBC, IRON, RETICCTPCT in the last 72 hours. Sepsis Labs: Recent Labs  Lab 01/19/19 1638  WBC 7.9    Microbiology Recent Results (from the past 240 hour(s))  SARS Coronavirus 2 (CEPHEID - Performed in [place] hospital lab), Hosp Order     Status: None   Collection Time: 01/19/19  7:27 PM   Specimen: Nasopharyngeal Swab  Result Value Ref Range Status   SARS Coronavirus 2 NEGATIVE NEGATIVE Final    Comment: (NOTE) If result is NEGATIVE SARS-CoV-2 target nucleic acids are NOT DETECTED. The SARS-CoV-2 RNA is generally detectable in upper and lower  respiratory specimens during the acute phase of infection. The lowest  concentration of SARS-CoV-2 viral copies this assay can detect is 250  copies / mL. A negative result does not preclude SARS-CoV-2 infection  and should not be used as the sole basis for treatment or other  patient management decisions.  A negative result may occur with  improper specimen collection / handling, submission of specimen other  than nasopharyngeal swab, presence of viral mutation(s) within the  areas targeted by this assay, and inadequate number of viral copies  (<250 copies / mL). A negative result must be combined with clinical  observations, patient history, and epidemiological information. If result is POSITIVE SARS-CoV-2 target nucleic acids are DETECTED. The SARS-CoV-2 RNA is generally detectable in upper and lower  respiratory specimens dur ing the acute phase of infection.  Positive  results are indicative of active infection with SARS-CoV-2.  Clinical  correlation with patient history and other diagnostic  information is  necessary to determine patient infection status.  Positive results do  not rule out bacterial infection or co-infection with other viruses. If result is PRESUMPTIVE POSTIVE SARS-CoV-2 nucleic acids MAY BE PRESENT.   A presumptive positive result was obtained on the submitted specimen  and confirmed on repeat testing.  While 2019 novel coronavirus  (SARS-CoV-2) nucleic acids may be present in the submitted sample  additional confirmatory testing may be necessary for epidemiological  and / or clinical management purposes  to differentiate between  SARS-CoV-2 and other Sarbecovirus currently known to infect humans.  If clinically indicated additional testing with an alternate test  methodology [phone]) is advised. The SARS-CoV-2 RNA is generally  detectable in upper and lower respiratory sp  ecimens during the acute  phase of infection. The expected result is Negative. Fact Sheet for Patients:  StrictlyIdeas.no Fact Sheet for Healthcare Providers: BankingDealers.co.za This test is not yet approved or cleared by the [place] FDA and has been authorized for detection and/or diagnosis of SARS-CoV-2 by FDA under an Emergency Use Authorization (EUA).  This EUA will remain in effect (meaning this test can be used) for the duration of the COVID-19 declaration under Section 564(b)(1) of the Act, 21 U.S.C. section 360bbb-3(b)(1), unless the authorization is terminated or revoked sooner. Performed at [facility], [place] [address], [place], [place] [address]     Procedures and diagnostic studies:  Ct Head Wo Contrast  Result Date: 01/19/2019 CLINICAL DATA:  Altered mental status.  Patient found down. EXAM: CT HEAD WITHOUT CONTRAST CT CERVICAL SPINE WITHOUT CONTRAST TECHNIQUE: Multidetector CT imaging of the head and cervical spine was performed following the standard protocol without intravenous contrast. Multiplanar CT  image reconstructions of the cervical spine were also generated. COMPARISON:  Head CT 12/04/2018 FINDINGS: CT HEAD FINDINGS Brain: Stable age related cerebral atrophy, ventriculomegaly and periventricular white matter disease. No extra-axial fluid collections are identified. No CT findings for acute hemispheric infarction or intracranial hemorrhage. No mass lesions. The brainstem and cerebellum are normal. Vascular: Stable vascular calcifications. No definite aneurysm or hyperdense vessels. Skull: No skull fracture or bone lesions. Sinuses/Orbits: Right mastoid effusions are noted. The left mastoid air cells are clear. The paranasal sinuses are clear. Other: No scalp lesions or scalp hematoma. CT CERVICAL SPINE FINDINGS Alignment: Grossly normal overall alignment. There is degenerative cervical spondylosis with multilevel disc disease and facet disease and multilevel degenerative subluxations. Skull base and vertebrae: Advanced degenerative changes at C1-2 with pannus formation. There are compression deformities of T1 and T3 with underlying findings suspicious for mixed lytic and sclerotic bone disease. Other sclerotic lesions are also noted and findings suspicious for metastatic disease. Does this patient have known primary? I do not have any prior studies for comparison but I suspect the compression fractures are remote. I do not see any obvious paraspinal tumor or hematoma. Soft tissues and spinal canal: No abnormal prevertebral soft tissue swelling. No obvious spinal hematoma. Disc levels: The spinal canal is fairly generous. No significant spinal stenosis. There is mild retropulsion of the T3 vertebral body noted. Upper chest: The lung apices are grossly clear. Other: Vascular calcifications but no neck mass or adenopathy. IMPRESSION: 1. Stable age related cerebral atrophy, ventriculomegaly and periventricular white matter disease. No acute intracranial findings or mass lesions. 2. Advanced degenerative  cervical spondylosis with multilevel disc disease and facet disease. 3. Compression deformities of T1 and T3 of uncertain age. Mild retropulsion at T3. 4. Sclerotic changes suspicious for metastatic disease. Recommend clinical correlation. Electronically Signed   By: Marijo Sanes M.D.   On: 01/19/2019 18:01   Ct Cervical Spine Wo Contrast  Result Date: 01/19/2019 CLINICAL DATA:  Altered mental status.  Patient found down. EXAM: CT HEAD WITHOUT CONTRAST CT CERVICAL SPINE WITHOUT CONTRAST TECHNIQUE: Multidetector CT imaging of the head and cervical spine was performed following the standard protocol without intravenous contrast. Multiplanar CT image reconstructions of the cervical spine were also generated. COMPARISON:  Head CT 12/04/2018 FINDINGS: CT HEAD FINDINGS Brain: Stable age related cerebral atrophy, ventriculomegaly and periventricular white matter disease. No extra-axial fluid collections are identified. No CT findings for acute hemispheric infarction or intracranial hemorrhage. No mass lesions. The brainstem and cerebellum are normal. Vascular: Stable vascular calcifications.  No definite aneurysm or hyperdense vessels. Skull: No skull fracture or bone lesions. Sinuses/Orbits: Right mastoid effusions are noted. The left mastoid air cells are clear. The paranasal sinuses are clear. Other: No scalp lesions or scalp hematoma. CT CERVICAL SPINE FINDINGS Alignment: Grossly normal overall alignment. There is degenerative cervical spondylosis with multilevel disc disease and facet disease and multilevel degenerative subluxations. Skull base and vertebrae: Advanced degenerative changes at C1-2 with pannus formation. There are compression deformities of T1 and T3 with underlying findings suspicious for mixed lytic and sclerotic bone disease. Other sclerotic lesions are also noted and findings suspicious for metastatic disease. Does this patient have known primary? I do not have any prior studies for comparison  but I suspect the compression fractures are remote. I do not see any obvious paraspinal tumor or hematoma. Soft tissues and spinal canal: No abnormal prevertebral soft tissue swelling. No obvious spinal hematoma. Disc levels: The spinal canal is fairly generous. No significant spinal stenosis. There is mild retropulsion of the T3 vertebral body noted. Upper chest: The lung apices are grossly clear. Other: Vascular calcifications but no neck mass or adenopathy. IMPRESSION: 1. Stable age related cerebral atrophy, ventriculomegaly and periventricular white matter disease. No acute intracranial findings or mass lesions. 2. Advanced degenerative cervical spondylosis with multilevel disc disease and facet disease. 3. Compression deformities of T1 and T3 of uncertain age. Mild retropulsion at T3. 4. Sclerotic changes suspicious for metastatic disease. Recommend clinical correlation. Electronically Signed   By: Marijo Sanes M.D.   On: 01/19/2019 18:01   Mr Brain Wo Contrast  Result Date: 01/20/2019 CLINICAL DATA:  85 y/o  F; generalized weakness and confusion. EXAM: MRI HEAD WITHOUT CONTRAST TECHNIQUE: Multiplanar, multiecho pulse sequences of the brain and surrounding structures were obtained without intravenous contrast. COMPARISON:  01/19/2019 CT head FINDINGS: Brain: No acute infarction, hemorrhage, hydrocephalus, extra-axial collection or mass lesion. Stable mild chronic microvascular ischemic changes and volume loss of the brain for age. Vascular: Normal flow voids. Skull and upper cervical spine: Advanced spondylosis of the cervical spine and prominent retro odontoid pannus. Calvarial bone marrow is unremarkable. Sinuses/Orbits: Partial opacification of the right mastoid air cells. Included paranasal sinuses and the left mastoid air cells demonstrate normal signal. Bilateral intra-ocular lens replacement. Other: None. IMPRESSION: 1. No acute intracranial abnormality identified. 2. Stable mild chronic  microvascular ischemic changes and volume loss of the brain for age. Electronically Signed   By: Kristine Garbe M.D.   On: 01/20/2019 04:24   Dg Pelvis Portable  Result Date: 01/20/2019 CLINICAL DATA:  Fall EXAM: PORTABLE PELVIS 1-2 VIEWS COMPARISON:  None. FINDINGS: Partial visualization of fixation hardware in the proximal right femur, with no evidence of hardware fracture or loosening. No evidence of hip dislocation on this single frontal view. No evidence of acute pelvic fracture or diastasis. There is periosteal reaction along the superior pubic rami bilaterally. No suspicious focal osseous lesions. IMPRESSION: No acute pelvic fracture or diastasis. Nonspecific periosteal reaction along the superior pubic rami bilaterally, cannot exclude subacute healing superior pubic rami fractures. Electronically Signed   By: Ilona Sorrel M.D.   On: 01/20/2019 06:30    Medications:    enoxaparin (LOVENOX) injection  40 mg Subcutaneous Q24H   potassium chloride  40 mEq Oral Once   Continuous Infusions:  magnesium sulfate bolus IVPB       LOS: 0 days   Geradine Girt  [facility]   How to contact the [facility] Attending or Consulting provider 7A -  7P or covering provider during after hours [place], for this patient?  1. Check the care team in [facility] and look for a) attending/consulting [facility] provider listed and b) the [facility] team listed 2. Log into [email or web address] and use  [facility] universal password to access. If you do not have the password, please contact the hospital operator. 3. Locate the [facility] provider you are looking for under [facility] and page to a number that you can be directly reached. 4. If you still have difficulty reaching the provider, please page the [facility] (Director on Call) for the Hospitalists listed on amion for assistance.  01/20/2019, 10:56 AM

## 2019-01-21 LAB — BASIC METABOLIC PANEL
Anion gap: 9 (ref 5–15)
BUN: 27 mg/dL — ABNORMAL HIGH (ref 8–23)
CO2: 28 mmol/L (ref 22–32)
Calcium: 8.4 mg/dL — ABNORMAL LOW (ref 8.9–10.3)
Chloride: 103 mmol/L (ref 98–111)
Creatinine, Ser: 0.64 mg/dL (ref 0.44–1.00)
GFR calc Af Amer: >60 mL/min (ref >60)
GFR calc non Af Amer: >60 mL/min (ref >60)
Glucose, Bld: 195 mg/dL — ABNORMAL HIGH (ref 70–99)
Potassium: 3.9 mmol/L (ref 3.5–5.1)
Sodium: 140 mmol/L (ref 135–145)

## 2019-01-21 LAB — AMMONIA: Ammonia: 30 umol/L (ref 9–35)

## 2019-01-21 LAB — URINE CULTURE

## 2019-01-21 LAB — VITAMIN B12: Vitamin B-12: 904 pg/mL (ref 180–914)

## 2019-01-21 MED ORDER — IPRATROPIUM-ALBUTEROL 0.5-2.5 (3) MG/3ML IN SOLN
3.0000 mL | Freq: Four times a day (QID) | RESPIRATORY_TRACT | Status: DC
  Administered 2019-01-21: 3 mL via RESPIRATORY_TRACT
  Filled 2019-01-21: qty 3

## 2019-01-21 MED ORDER — BISACODYL 10 MG RE SUPP: 10.0000 mg | Freq: Every day | RECTAL | Status: DC | PRN

## 2019-01-21 MED ORDER — ENOXAPARIN SODIUM 30 MG/0.3ML ~~LOC~~ SOLN
30.0000 mg | SUBCUTANEOUS | Status: DC
  Administered 2019-01-21 – 2019-01-23 (×3): 30 mg via SUBCUTANEOUS
  Filled 2019-01-21 (×3): qty 0.3

## 2019-01-21 MED ORDER — SENNOSIDES-DOCUSATE SODIUM 8.6-50 MG PO TABS
1.0000 | ORAL_TABLET | Freq: Every day | ORAL | Status: DC | PRN
  Administered 2019-01-21: 1 via ORAL
  Filled 2019-01-21: qty 1

## 2019-01-21 MED ORDER — SORBITOL 70 % SOLN
20.0000 mL | Freq: Every day | Status: DC | PRN
  Administered 2019-01-21: 20 mL via ORAL
  Filled 2019-01-21: qty 30

## 2019-01-21 MED ORDER — ALBUTEROL SULFATE (2.5 MG/3ML) 0.083% IN NEBU: 2.5000 mg | INHALATION_SOLUTION | RESPIRATORY_TRACT | Status: DC | PRN

## 2019-01-21 MED ORDER — FUROSEMIDE 20 MG PO TABS
20.0000 mg | ORAL_TABLET | Freq: Once | ORAL | Status: AC
  Administered 2019-01-21: 20 mg via ORAL
  Filled 2019-01-21: qty 1

## 2019-01-21 NOTE — Plan of Care (Signed)
Progressing towards goals

## 2019-01-21 NOTE — Progress Notes (Signed)
Progress Note    Beverly Adkins  PFX:[id number] DOB: 07/16/1925  DOA: 01/19/2019 PCP: Shawna Clamp, MD    Brief Narrative:    Medical records reviewed and are as summarized below:  Beverly Adkins is an 85 y.o. female  with history of dementia, hypertension, chronic lower extremity edema COPD who has not been taking any of her medications was brought to the ER after patient was found on the floor in the bathroom of her living facility.  Per daughter patient has been living with her last 2 months since recent COVID pandemic and was placed back in the independent living  facility yesterday.  Family went to check on her today and was found on the floor confused more than normal.   Assessment/Plan:   Principal Problem:   Near syncope Active Problems:   Hypokalemia   Dementia (HCC)  AMS with dementia-- different from baseline, had a reported fall -MRI negative for acute events, chronic changes noted -recent UTI-- urinalysis unremarkable but will get culture to ensure resolution  -check x ray negative - B12/ammonia unremarkable -check orthostatics  Severe hypokalemia - cause not clear (no reported diarrhea-- not taking any medications at home) -replace Mg as well -AM labs pending- if K improved will d/c tele  Chronic lower extremity edema  -appears at baseline  dementia  -previously was able to function in an ILF -not at baseline currently-  with PT was unable to sit independent on the side of bed  History of hypertension  -not on any medications (patient's choice)  sclerotic changes seen in the CAT scan concerning for metastatic disease.   -discussed with family-- not keen on further work up which is reasonable   Family Communication/Anticipated D/C date and plan/Code Status   DVT prophylaxis: Lovenox ordered. Code Status: Full Code.  Family Communication: spoke with daughter 6/20 Disposition Plan: from [place] [place], will need higher level of care upon d/c- SNF   Medical  Consultants:    None.     Subjective:   Denies pain  Objective:    Vitals:   01/20/19 2110 01/20/19 2315 01/21/19 0420 01/21/19 0829  BP: (!) 162/72 (!) 154/114 (!) 171/120 (!) 151/81  Pulse: 100 (!) 107 (!) 109 100  Resp: 18  18 18   Temp: 98.3 F (36.8 C) 97.8 F (36.6 C) 98.2 F (36.8 C) 98 F (36.7 C)  TempSrc: Oral Oral Oral Oral  SpO2: 94% 95% 100% 100%  Weight:      Height:       No intake or output data in the 24 hours ending 01/21/19 0845 Filed Weights   01/19/19 1547  Weight: 43 kg    Exam: In bed, chronically ill appearing Has purewick with dark urine output rrr Firm area in lower abdomen- tender to touch +LE edema alert   Data Reviewed:   I have personally reviewed following labs and imaging studies:  Labs: Labs show the following:   Basic Metabolic Panel: Recent Labs  Lab 01/19/19 1638 01/19/19 2354 01/20/19 0926  NA 139  --  142  K 2.6*  --  2.9*  CL 98  --  102  CO2 30  --  26  GLUCOSE 115*  --  74  BUN 25*  --  21  CREATININE 0.66  --  0.71  CALCIUM 8.7*  --  7.8*  MG  --  1.8  --    GFR Estimated Creatinine Clearance: 27.3 mL/min (by C-G formula based on SCr of 0.71 mg/dL). Liver  Function Tests: Recent Labs  Lab 01/19/19 1638  AST 35  ALT 24  ALKPHOS 68  BILITOT 0.7  PROT 6.3*  ALBUMIN 3.6   No results for input(s): LIPASE, AMYLASE in the last 168 hours. Recent Labs  Lab 01/21/19 0554  AMMONIA 30   Coagulation profile No results for input(s): INR, PROTIME in the last 168 hours.  CBC: Recent Labs  Lab 01/19/19 1638 01/20/19 0926  WBC 7.9 9.2  NEUTROABS 6.6  --   HGB 12.7 11.9*  HCT 40.0 38.1  MCV 88.9 89.4  PLT 233 253   Cardiac Enzymes: Recent Labs  Lab 01/19/19 1919 01/19/19 2354 01/20/19 0926 01/20/19 1144  CKTOTAL 428*  --   --   --   CKMB 13.5*  --   --   --   TROPONINI  --  0.03* 0.03* <0.03   BNP (last 3 results) No results for input(s): PROBNP in the last 8760 hours. CBG: No  results for input(s): GLUCAP in the last 168 hours. D-Dimer: No results for input(s): DDIMER in the last 72 hours. Hgb A1c: No results for input(s): HGBA1C in the last 72 hours. Lipid Profile: No results for input(s): CHOL, HDL, LDLCALC, TRIG, CHOLHDL, LDLDIRECT in the last 72 hours. Thyroid function studies: No results for input(s): TSH, T4TOTAL, T3FREE, THYROIDAB in the last 72 hours.  Invalid input(s): FREET3 Anemia work up: Recent Labs    01/21/19 [place] 904   Sepsis Labs: Recent Labs  Lab 01/19/19 1638 01/20/19 0926  WBC 7.9 9.2    Microbiology Recent Results (from the past 240 hour(s))  SARS Coronavirus 2 (CEPHEID - Performed in [facility] hospital lab), Hosp Order     Status: None   Collection Time: 01/19/19  7:27 PM   Specimen: Nasopharyngeal Swab  Result Value Ref Range Status   SARS Coronavirus 2 NEGATIVE NEGATIVE Final    Comment: (NOTE) If result is NEGATIVE SARS-CoV-2 target nucleic acids are NOT DETECTED. The SARS-CoV-2 RNA is generally detectable in upper and lower  respiratory specimens during the acute phase of infection. The lowest  concentration of SARS-CoV-2 viral copies this assay can detect is 250  copies / mL. A negative result does not preclude SARS-CoV-2 infection  and should not be used as the sole basis for treatment or other  patient management decisions.  A negative result may occur with  improper specimen collection / handling, submission of specimen other  than nasopharyngeal swab, presence of viral mutation(s) within the  areas targeted by this assay, and inadequate number of viral copies  (<250 copies / mL). A negative result must be combined with clinical  observations, patient history, and epidemiological information. If result is POSITIVE SARS-CoV-2 target nucleic acids are DETECTED. The SARS-CoV-2 RNA is generally detectable in upper and lower  respiratory specimens dur ing the acute phase of infection.  Positive    results are indicative of active infection with SARS-CoV-2.  Clinical  correlation with patient history and other diagnostic information is  necessary to determine patient infection status.  Positive results do  not rule out bacterial infection or co-infection with other viruses. If result is PRESUMPTIVE POSTIVE SARS-CoV-2 nucleic acids MAY BE PRESENT.   A presumptive positive result was obtained on the submitted specimen  and confirmed on repeat testing.  While 2019 novel coronavirus  (SARS-CoV-2) nucleic acids may be present in the submitted sample  additional confirmatory testing may be necessary for epidemiological  and / or clinical management purposes  to  differentiate between  SARS-CoV-2 and other Sarbecovirus currently known to infect humans.  If clinically indicated additional testing with an alternate test  methodology [phone]) is advised. The SARS-CoV-2 RNA is generally  detectable in upper and lower respiratory sp ecimens during the acute  phase of infection. The expected result is Negative. Fact Sheet for Patients:  StrictlyIdeas.no Fact Sheet for Healthcare Providers: BankingDealers.co.za This test is not yet approved or cleared by the [place] FDA and has been authorized for detection and/or diagnosis of SARS-CoV-2 by FDA under an Emergency Use Authorization (EUA).  This EUA will remain in effect (meaning this test can be used) for the duration of the COVID-19 declaration under Section 564(b)(1) of the Act, 21 U.S.C. section 360bbb-3(b)(1), unless the authorization is terminated or revoked sooner. Performed at [facility], [place] [address], [place], [place] [address]   MRSA PCR Screening     Status: None   Collection Time: 01/20/19 12:30 PM   Specimen: Nasopharyngeal  Result Value Ref Range Status   MRSA by PCR NEGATIVE NEGATIVE Final    Comment:        The GeneXpert MRSA Assay (FDA approved for NASAL  specimens only), is one component of a comprehensive MRSA colonization surveillance program. It is not intended to diagnose MRSA infection nor to guide or monitor treatment for MRSA infections. Performed at [facility], [place] [address], [place], [place] [address]     Procedures and diagnostic studies:  Ct Head Wo Contrast  Result Date: 01/19/2019 CLINICAL DATA:  Altered mental status.  Patient found down. EXAM: CT HEAD WITHOUT CONTRAST CT CERVICAL SPINE WITHOUT CONTRAST TECHNIQUE: Multidetector CT imaging of the head and cervical spine was performed following the standard protocol without intravenous contrast. Multiplanar CT image reconstructions of the cervical spine were also generated. COMPARISON:  Head CT 12/04/2018 FINDINGS: CT HEAD FINDINGS Brain: Stable age related cerebral atrophy, ventriculomegaly and periventricular white matter disease. No extra-axial fluid collections are identified. No CT findings for acute hemispheric infarction or intracranial hemorrhage. No mass lesions. The brainstem and cerebellum are normal. Vascular: Stable vascular calcifications. No definite aneurysm or hyperdense vessels. Skull: No skull fracture or bone lesions. Sinuses/Orbits: Right mastoid effusions are noted. The left mastoid air cells are clear. The paranasal sinuses are clear. Other: No scalp lesions or scalp hematoma. CT CERVICAL SPINE FINDINGS Alignment: Grossly normal overall alignment. There is degenerative cervical spondylosis with multilevel disc disease and facet disease and multilevel degenerative subluxations. Skull base and vertebrae: Advanced degenerative changes at C1-2 with pannus formation. There are compression deformities of T1 and T3 with underlying findings suspicious for mixed lytic and sclerotic bone disease. Other sclerotic lesions are also noted and findings suspicious for metastatic disease. Does this patient have known primary? I do not have any prior studies for comparison  but I suspect the compression fractures are remote. I do not see any obvious paraspinal tumor or hematoma. Soft tissues and spinal canal: No abnormal prevertebral soft tissue swelling. No obvious spinal hematoma. Disc levels: The spinal canal is fairly generous. No significant spinal stenosis. There is mild retropulsion of the T3 vertebral body noted. Upper chest: The lung apices are grossly clear. Other: Vascular calcifications but no neck mass or adenopathy. IMPRESSION: 1. Stable age related cerebral atrophy, ventriculomegaly and periventricular white matter disease. No acute intracranial findings or mass lesions. 2. Advanced degenerative cervical spondylosis with multilevel disc disease and facet disease. 3. Compression deformities of T1 and T3 of uncertain age. Mild retropulsion  at T3. 4. Sclerotic changes suspicious for metastatic disease. Recommend clinical correlation. Electronically Signed   By: Marijo Sanes M.D.   On: 01/19/2019 18:01   Ct Cervical Spine Wo Contrast  Result Date: 01/19/2019 CLINICAL DATA:  Altered mental status.  Patient found down. EXAM: CT HEAD WITHOUT CONTRAST CT CERVICAL SPINE WITHOUT CONTRAST TECHNIQUE: Multidetector CT imaging of the head and cervical spine was performed following the standard protocol without intravenous contrast. Multiplanar CT image reconstructions of the cervical spine were also generated. COMPARISON:  Head CT 12/04/2018 FINDINGS: CT HEAD FINDINGS Brain: Stable age related cerebral atrophy, ventriculomegaly and periventricular white matter disease. No extra-axial fluid collections are identified. No CT findings for acute hemispheric infarction or intracranial hemorrhage. No mass lesions. The brainstem and cerebellum are normal. Vascular: Stable vascular calcifications. No definite aneurysm or hyperdense vessels. Skull: No skull fracture or bone lesions. Sinuses/Orbits: Right mastoid effusions are noted. The left mastoid air cells are clear. The paranasal  sinuses are clear. Other: No scalp lesions or scalp hematoma. CT CERVICAL SPINE FINDINGS Alignment: Grossly normal overall alignment. There is degenerative cervical spondylosis with multilevel disc disease and facet disease and multilevel degenerative subluxations. Skull base and vertebrae: Advanced degenerative changes at C1-2 with pannus formation. There are compression deformities of T1 and T3 with underlying findings suspicious for mixed lytic and sclerotic bone disease. Other sclerotic lesions are also noted and findings suspicious for metastatic disease. Does this patient have known primary? I do not have any prior studies for comparison but I suspect the compression fractures are remote. I do not see any obvious paraspinal tumor or hematoma. Soft tissues and spinal canal: No abnormal prevertebral soft tissue swelling. No obvious spinal hematoma. Disc levels: The spinal canal is fairly generous. No significant spinal stenosis. There is mild retropulsion of the T3 vertebral body noted. Upper chest: The lung apices are grossly clear. Other: Vascular calcifications but no neck mass or adenopathy. IMPRESSION: 1. Stable age related cerebral atrophy, ventriculomegaly and periventricular white matter disease. No acute intracranial findings or mass lesions. 2. Advanced degenerative cervical spondylosis with multilevel disc disease and facet disease. 3. Compression deformities of T1 and T3 of uncertain age. Mild retropulsion at T3. 4. Sclerotic changes suspicious for metastatic disease. Recommend clinical correlation. Electronically Signed   By: Marijo Sanes M.D.   On: 01/19/2019 18:01   Mr Brain Wo Contrast  Result Date: 01/20/2019 CLINICAL DATA:  85 y/o  F; generalized weakness and confusion. EXAM: MRI HEAD WITHOUT CONTRAST TECHNIQUE: Multiplanar, multiecho pulse sequences of the brain and surrounding structures were obtained without intravenous contrast. COMPARISON:  01/19/2019 CT head FINDINGS: Brain: No acute  infarction, hemorrhage, hydrocephalus, extra-axial collection or mass lesion. Stable mild chronic microvascular ischemic changes and volume loss of the brain for age. Vascular: Normal flow voids. Skull and upper cervical spine: Advanced spondylosis of the cervical spine and prominent retro odontoid pannus. Calvarial bone marrow is unremarkable. Sinuses/Orbits: Partial opacification of the right mastoid air cells. Included paranasal sinuses and the left mastoid air cells demonstrate normal signal. Bilateral intra-ocular lens replacement. Other: None. IMPRESSION: 1. No acute intracranial abnormality identified. 2. Stable mild chronic microvascular ischemic changes and volume loss of the brain for age. Electronically Signed   By: Kristine Garbe M.D.   On: 01/20/2019 04:24   Dg Pelvis Portable  Result Date: 01/20/2019 CLINICAL DATA:  Fall EXAM: PORTABLE PELVIS 1-2 VIEWS COMPARISON:  None. FINDINGS: Partial visualization of fixation hardware in the proximal right femur, with no evidence of hardware  fracture or loosening. No evidence of hip dislocation on this single frontal view. No evidence of acute pelvic fracture or diastasis. There is periosteal reaction along the superior pubic rami bilaterally. No suspicious focal osseous lesions. IMPRESSION: No acute pelvic fracture or diastasis. Nonspecific periosteal reaction along the superior pubic rami bilaterally, cannot exclude subacute healing superior pubic rami fractures. Electronically Signed   By: Ilona Sorrel M.D.   On: 01/20/2019 06:30   Dg Chest Port 1 View  Result Date: 01/20/2019 CLINICAL DATA:  Altered mental status EXAM: PORTABLE CHEST 1 VIEW COMPARISON:  12/04/2018 FINDINGS: Marked elevation of the LEFT hemidiaphragm and mediastinal shift to the RIGHT is unchanged. Mild LEFT LOWER lung atelectasis/scarring again noted. There is no evidence of focal airspace disease, pulmonary edema, suspicious pulmonary nodule/mass, pleural effusion, or  pneumothorax. No acute bony abnormalities are identified. IMPRESSION: No active disease. Electronically Signed   By: Margarette Canada M.D.   On: 01/20/2019 12:08    Medications:    enoxaparin (LOVENOX) injection  40 mg Subcutaneous Q24H   Continuous Infusions:    LOS: 1 day   Geradine Girt  [facility]   How to contact the [facility] Attending or Consulting provider [place] or covering provider during after hours [place], for this patient?  1. Check the care team in [facility] and look for a) attending/consulting TR[facility] provider listed and b) the [facility] team listed 2. Log into [email or web address] and use [facility]'s universal password to access. If you do not have the password, please contact the hospital operator. 3. Locate the [facility] provider you are looking for under [facility] and page to a number that you can be directly reached. 4. If you still have difficulty reaching the provider, please page the [facility] (Director on Call) for the Hospitalists listed on amion for assistance.  01/21/2019, 8:45 AM

## 2019-01-22 ENCOUNTER — Inpatient Hospital Stay (HOSPITAL_COMMUNITY): Payer: Medicare Other

## 2019-01-22 DIAGNOSIS — R55 Syncope and collapse: Secondary | ICD-10-CM

## 2019-01-22 DIAGNOSIS — W19XXXS Unspecified fall, sequela: Secondary | ICD-10-CM

## 2019-01-22 LAB — ECHOCARDIOGRAM COMPLETE
Height: 57 in
Weight: 1516.76 oz

## 2019-01-22 MED ORDER — IPRATROPIUM-ALBUTEROL 0.5-2.5 (3) MG/3ML IN SOLN
3.0000 mL | Freq: Four times a day (QID) | RESPIRATORY_TRACT | Status: DC | PRN
  Administered 2019-01-23: 3 mL via RESPIRATORY_TRACT
  Filled 2019-01-22: qty 3

## 2019-01-22 MED ORDER — IPRATROPIUM-ALBUTEROL 0.5-2.5 (3) MG/3ML IN SOLN
3.0000 mL | Freq: Three times a day (TID) | RESPIRATORY_TRACT | Status: DC
  Administered 2019-01-22: 3 mL via RESPIRATORY_TRACT
  Filled 2019-01-22: qty 3

## 2019-01-22 NOTE — Plan of Care (Signed)
  Problem: Nutrition: Goal: Adequate nutrition will be maintained Outcome: Progressing   

## 2019-01-22 NOTE — TOC Progression Note (Signed)
Transition of Care [facility]) - Progression Note    Patient Details  Name: Beverly Adkins MRN: [id number] Date of Birth: 1925-06-13  Transition of Care [facility]) CM/SW [place], [place] Phone Number: 01/22/2019, 12:16 PM  Clinical Narrative:     CSW spoke with the patient's daughter Beverly Adkins. CSW informed her that her mother only had one bed offer, [facility]. She stated that she would prefer to not send her mother there. She would like [facility] as her first choice. CSW informed her that [facility] is usually full and that it would be wise to have a back up. Her second choices would be [facility] or [facility] in [place]. CSW faxed the patient out to [facility]. CSW is awaiting more bed offers.   CSW will continue to assist with disposition planning.    Expected Discharge Plan: [place] Barriers to Discharge: Continued Medical Work up  Expected Discharge Plan and Services Expected Discharge Plan: [place] In-house Referral: Clinical Social Work Discharge Planning Services: NA Post Acute Care Choice: [place] Living arrangements for the past 2 months: [place]                 DME Arranged: N/A DME Agency: NA       HH Arranged: NA [place] Agency: NA         Social Determinants of Health (SDOH) Interventions    Readmission Risk Interventions No flowsheet data found.

## 2019-01-22 NOTE — Progress Notes (Addendum)
Progress Note    De Libman  YDX:[id number] DOB: [birth year]  DOA: 01/19/2019 PCP: Shawna Clamp, MD    Brief Narrative:    Medical records reviewed and are as summarized below:  Beverly Adkins is an 85 y.o. female  with history of dementia, hypertension, chronic lower extremity edema COPD who has not been taking any of her medications was brought to the ER after patient was found on the floor in the bathroom of her living facility.  Per daughter patient has been living with her last 2 months since recent COVID pandemic and was placed back in the independent living  facility yesterday.  Family went to check on her today and was found on the floor confused more than normal.   Assessment/Plan:   Principal Problem:   Near syncope Active Problems:   Hypokalemia   Dementia (HCC)  AMS with dementia-- different from baseline, had a reported fall -MRI negative for acute events, chronic changes noted -recent UTI-- urinalysis unremarkable and culture w/o growth  -chest x ray negative - B12/ammonia unremarkable -patient became symptomatic with orthostatics -check echo  Severe hypokalemia - cause not clear (no reported diarrhea-- not taking any medications at home) -replaced Mg as well  Chronic lower extremity edema  -appears at baseline  dementia  -previously was able to function in an ILF prior -may be mentally back to her baseline but physically still needs help  History of hypertension  -not on any medications (patient's choice)  sclerotic changes seen in the CAT scan concerning for metastatic disease.   -discussed with family-- not keen on further work up which is reasonable  Has had multiple falls in last year-- needs continued PT  Family Communication/Anticipated D/C date and plan/Code Status   DVT prophylaxis: Lovenox ordered. Code Status: Full Code.  Family Communication: spoke with daughter 6/22 Disposition Plan: from [place], will need higher level of care upon  d/c- SNF   Medical Consultants:    None.     Subjective:   No chest pain  Objective:    Vitals:   01/22/19 0018 01/22/19 0539 01/22/19 0752 01/22/19 0840  BP: (!) 158/70 (!) 142/58 (!) 147/65   Pulse: 85 69 77   Resp: 18 18 17    Temp: 97.8 F (36.6 C) 97.6 F (36.4 C) 97.9 F (36.6 C)   TempSrc: Oral Oral Axillary   SpO2: 96% 94% 95% 96%  Weight:      Height:        Intake/Output Summary (Last 24 hours) at 01/22/2019 1141 Last data filed at 01/22/2019 0800 Gross per 24 hour  Intake 600 ml  Output -  Net 600 ml   Filed Weights   01/19/19 1547  Weight: 43 kg    Exam: In bed, NAD rrr Clear, no wheezing +LE edema (skin thickened but no pitting)   Data Reviewed:   I have personally reviewed following labs and imaging studies:  Labs: Labs show the following:   Basic Metabolic Panel: Recent Labs  Lab 01/19/19 1638 01/19/19 2354 01/20/19 0926 01/21/19 1019  NA 139  --  142 140  K 2.6*  --  2.9* 3.9  CL 98  --  102 103  CO2 30  --  26 28  GLUCOSE 115*  --  74 195*  BUN 25*  --  21 27*  CREATININE 0.66  --  0.71 0.64  CALCIUM 8.7*  --  7.8* 8.4*  MG  --  1.8  --   --  GFR Estimated Creatinine Clearance: 27.3 mL/min (by C-G formula based on SCr of 0.64 mg/dL). Liver Function Tests: Recent Labs  Lab 01/19/19 1638  AST 35  ALT 24  ALKPHOS 68  BILITOT 0.7  PROT 6.3*  ALBUMIN 3.6   No results for input(s): LIPASE, AMYLASE in the last 168 hours. Recent Labs  Lab 01/21/19 0554  AMMONIA 30   Coagulation profile No results for input(s): INR, PROTIME in the last 168 hours.  CBC: Recent Labs  Lab 01/19/19 1638 01/20/19 0926  WBC 7.9 9.2  NEUTROABS 6.6  --   HGB 12.7 11.9*  HCT 40.0 38.1  MCV 88.9 89.4  PLT 233 253   Cardiac Enzymes: Recent Labs  Lab 01/19/19 1919 01/19/19 2354 01/20/19 0926 01/20/19 1144  CKTOTAL 428*  --   --   --   CKMB 13.5*  --   --   --   TROPONINI  --  0.03* 0.03* <0.03   BNP (last 3 results)  No results for input(s): PROBNP in the last 8760 hours. CBG: No results for input(s): GLUCAP in the last 168 hours. D-Dimer: No results for input(s): DDIMER in the last 72 hours. Hgb A1c: No results for input(s): HGBA1C in the last 72 hours. Lipid Profile: No results for input(s): CHOL, HDL, LDLCALC, TRIG, CHOLHDL, LDLDIRECT in the last 72 hours. Thyroid function studies: No results for input(s): TSH, T4TOTAL, T3FREE, THYROIDAB in the last 72 hours.  Invalid input(s): FREET3 Anemia work up: Recent Labs    01/21/19 [place] 904   Sepsis Labs: Recent Labs  Lab 01/19/19 1638 01/20/19 0926  WBC 7.9 9.2    Microbiology Recent Results (from the past 240 hour(s))  SARS Coronavirus 2 (CEPHEID - Performed in [place] hospital lab), Hosp Order     Status: None   Collection Time: 01/19/19  7:27 PM   Specimen: Nasopharyngeal Swab  Result Value Ref Range Status   SARS Coronavirus 2 NEGATIVE NEGATIVE Final    Comment: (NOTE) If result is NEGATIVE SARS-CoV-2 target nucleic acids are NOT DETECTED. The SARS-CoV-2 RNA is generally detectable in upper and lower  respiratory specimens during the acute phase of infection. The lowest  concentration of SARS-CoV-2 viral copies this assay can detect is 250  copies / mL. A negative result does not preclude SARS-CoV-2 infection  and should not be used as the sole basis for treatment or other  patient management decisions.  A negative result may occur with  improper specimen collection / handling, submission of specimen other  than nasopharyngeal swab, presence of viral mutation(s) within the  areas targeted by this assay, and inadequate number of viral copies  (<250 copies / mL). A negative result must be combined with clinical  observations, patient history, and epidemiological information. If result is POSITIVE SARS-CoV-2 target nucleic acids are DETECTED. The SARS-CoV-2 RNA is generally detectable in upper and lower   respiratory specimens dur ing the acute phase of infection.  Positive  results are indicative of active infection with SARS-CoV-2.  Clinical  correlation with patient history and other diagnostic information is  necessary to determine patient infection status.  Positive results do  not rule out bacterial infection or co-infection with other viruses. If result is PRESUMPTIVE POSTIVE SARS-CoV-2 nucleic acids MAY BE PRESENT.   A presumptive positive result was obtained on the submitted specimen  and confirmed on repeat testing.  While 2019 novel coronavirus  (SARS-CoV-2) nucleic acids may be present in the submitted sample  additional confirmatory  testing may be necessary for epidemiological  and / or clinical management purposes  to differentiate between  SARS-CoV-2 and other Sarbecovirus currently known to infect humans.  If clinically indicated additional testing with an alternate test  methodology [phone]) is advised. The SARS-CoV-2 RNA is generally  detectable in upper and lower respiratory sp ecimens during the acute  phase of infection. The expected result is Negative. Fact Sheet for Patients:  StrictlyIdeas.no Fact Sheet for Healthcare Providers: BankingDealers.co.za This test is not yet approved or cleared by the [place] FDA and has been authorized for detection and/or diagnosis of SARS-CoV-2 by FDA under an Emergency Use Authorization (EUA).  This EUA will remain in effect (meaning this test can be used) for the duration of the COVID-19 declaration under Section 564(b)(1) of the Act, 21 U.S.C. section 360bbb-3(b)(1), unless the authorization is terminated or revoked sooner. Performed at [facility], [place] [address], [place], [place] [address]   Culture, Urine     Status: Abnormal   Collection Time: 01/20/19 10:56 AM   Specimen: Urine, Random  Result Value Ref Range Status   Specimen Description URINE, RANDOM   Final   Special Requests   Final    NONE Performed at [facility], [place] [address], [place], [place] [address]    Culture MULTIPLE SPECIES PRESENT, SUGGEST RECOLLECTION (A)  Final   Report Status 01/21/2019 FINAL  Final  MRSA PCR Screening     Status: None   Collection Time: 01/20/19 12:30 PM   Specimen: Nasopharyngeal  Result Value Ref Range Status   MRSA by PCR NEGATIVE NEGATIVE Final    Comment:        The GeneXpert MRSA Assay (FDA approved for NASAL specimens only), is one component of a comprehensive MRSA colonization surveillance program. It is not intended to diagnose MRSA infection nor to guide or monitor treatment for MRSA infections. Performed at [facility], [place] [address] [place],  [address]     Procedures and diagnostic studies:  No results found.  Medications:   . enoxaparin (LOVENOX) injection  30 mg Subcutaneous Q24H  . ipratropium-albuterol  3 mL Nebulization TID   Continuous Infusions:    LOS: 2 days   Geradine Girt  [facility]   How to contact the [facility] Attending or Consulting provider [place] or covering provider during after hours [place], for this patient?  1. Check the care team in [facility] and look for a) attending/consulting TRH provider listed and b) the [facility] team listed 2. Log into [email or web address] and use [place]'s universal password to access. If you do not have the password, please contact the hospital operator. 3. Locate the [facility] provider you are looking for under [facility] and page to a number that you can be directly reached. 4. If you still have difficulty reaching the provider, please page the [facility] (Director on Call) for the Hospitalists listed on amion for assistance.  01/22/2019, 11:41 AM

## 2019-01-22 NOTE — Evaluation (Signed)
Clinical/Bedside Swallow Evaluation Patient Details  Name: Beverly Adkins MRN: [id number] Date of Birth: 1924/08/03  Today's Date: 01/22/2019 Time: SLP Start Time (ACUTE ONLY): 0955 SLP Stop Time (ACUTE ONLY): 1025 SLP Time Calculation (min) (ACUTE ONLY): 30 min  Past Medical History:  Past Medical History:  Diagnosis Date  . Abrasion of cornea, left   . Anal ulcer   . Anxiety   . Aphthous ulcer   . Carotid artery stenosis   . COPD (chronic obstructive pulmonary disease) ([place])   . Elevated hemidiaphragm    Left  . Fracture of femur, shaft ([place])    closed, Right  . GERD (gastroesophageal reflux disease)   . Hyperkalemia   . Incontinence of urine    urge  . Osteoarthritis   . Osteoporosis   . Peripheral neuropathy    Past Surgical History:  Past Surgical History:  Procedure Laterality Date  . CATARACT EXTRACTION    . CESAREAN SECTION    . FOOT SURGERY    . HIP SURGERY     right, Dr. Ninfa Linden  . neuroplasty decompression median nerve at carpal tunnel     HPI:  Ms Beverly Adkins, 89y/f persented to ED after a fall in bathroom at her living facility. PMH of dementia, hypertension, chronic lower extremity edema and COPD. MRI showed no acute intracranial abnormality.    Assessment / Plan / Recommendation Clinical Impression  Patient had no signs or symptoms of dysphagia. She was able to perform her own mouthcare. Normal oral motor and neuro exam. Ice chips, water by cup and straw, puree and soft solid tolerated well with no s/s of aspiration, no changes in respiration or vocal quality. Recommend continuing regular diet and thin liquids. Medication whole in puree. No further ST services needed at this time.  SLP Visit Diagnosis: Dysphagia, unspecified (R13.10)    Aspiration Risk  Mild aspiration risk    Diet Recommendation Regular;Thin liquid   Liquid Administration via: Cup;Straw Medication Administration: Whole meds with puree Supervision: Patient able to self  feed Compensations: Slow rate;Minimize environmental distractions;Small sips/bites Postural Changes: Seated upright at 90 degrees    Other  Recommendations Oral Care Recommendations: Oral care BID                       Prognosis Prognosis for Safe Diet Advancement: Good Barriers to Reach Goals: Cognitive deficits      Swallow Study   General Date of Onset: 01/19/19 HPI: Ms Beverly Adkins, 89y/f persented to ED after a fall in bathroom at her living facility. PMH of dementia, hypertension, chronic lower extremity edema and COPD. MRI showed no acute intracranial abnormality.  Type of Study: Bedside Swallow Evaluation Diet Prior to this Study: Regular;Thin liquids Temperature Spikes Noted: No Respiratory Status: Room air History of Recent Intubation: No Behavior/Cognition: Alert;Cooperative;Pleasant mood Oral Cavity Assessment: Within Functional Limits Oral Care Completed by SLP: Yes Oral Cavity - Dentition: Adequate natural dentition Vision: Functional for self-feeding Self-Feeding Abilities: Able to feed self Patient Positioning: Upright in bed Baseline Vocal Quality: Normal Volitional Cough: Strong Volitional Swallow: Able to elicit    Oral/Motor/Sensory Function Overall Oral Motor/Sensory Function: Within functional limits   Ice Chips Ice chips: Within functional limits Presentation: Spoon   Thin Liquid Thin Liquid: Within functional limits Presentation: Cup;Straw    Nectar Thick Nectar Thick Liquid: Not tested   Honey Thick Honey Thick Liquid: Not tested   Puree Puree: Within functional limits Presentation: Spoon   Solid  Solid: Within functional limits Presentation: [place], MA, CCC-SLP 01/22/2019 10:35 AM

## 2019-01-23 DIAGNOSIS — R531 Weakness: Secondary | ICD-10-CM

## 2019-01-23 LAB — BASIC METABOLIC PANEL WITH GFR
Anion gap: 8 (ref 5–15)
BUN: 31 mg/dL — ABNORMAL HIGH (ref 8–23)
CO2: 31 mmol/L (ref 22–32)
Calcium: 8.5 mg/dL — ABNORMAL LOW (ref 8.9–10.3)
Chloride: 103 mmol/L (ref 98–111)
Creatinine, Ser: 0.67 mg/dL (ref 0.44–1.00)
GFR calc Af Amer: >60 mL/min
GFR calc non Af Amer: >60 mL/min
Glucose, Bld: 106 mg/dL — ABNORMAL HIGH (ref 70–99)
Potassium: 4.3 mmol/L (ref 3.5–5.1)
Sodium: 142 mmol/L (ref 135–145)

## 2019-01-23 LAB — CBC
HCT: 38.7 % (ref 36.0–46.0)
Hemoglobin: 12.2 g/dL (ref 12.0–15.0)
MCH: 28.3 pg (ref 26.0–34.0)
MCHC: 31.5 g/dL (ref 30.0–36.0)
MCV: 89.8 fL (ref 80.0–100.0)
Platelets: 245 10*3/uL (ref 150–400)
RBC: 4.31 MIL/uL (ref 3.87–5.11)
RDW: 14.8 % (ref 11.5–15.5)
WBC: 7 10*3/uL (ref 4.0–10.5)
nRBC: 0 % (ref 0.0–0.2)

## 2019-01-23 MED ORDER — HYDROXYZINE HCL 10 MG PO TABS
5.0000 mg | ORAL_TABLET | Freq: Three times a day (TID) | ORAL | Status: DC | PRN
  Filled 2019-01-23: qty 1

## 2019-01-23 MED ORDER — DOCUSATE SODIUM 100 MG PO CAPS: 100.0000 mg | ORAL_CAPSULE | Freq: Every day | ORAL | Status: DC

## 2019-01-23 MED ORDER — CARVEDILOL 3.125 MG PO TABS
3.1250 mg | ORAL_TABLET | Freq: Two times a day (BID) | ORAL | Status: DC
  Administered 2019-01-23 – 2019-01-24 (×3): 3.125 mg via ORAL
  Filled 2019-01-23 (×3): qty 1

## 2019-01-23 NOTE — Progress Notes (Signed)
Physical Therapy Treatment Patient Details Name: Beverly Adkins MRN: [id number] DOB: May 11, 1925 Today's Date: 01/23/2019    History of Present Illness 85 y.o. female with history of dementia, hypertension, chronic lower extremity edema COPD who has not been taking any of her medications was brought to the ER after patient was found on the floor in the bathroom of her living facility.  MRI revealed No acute infarction, hemorrhage, hydrocephalus, extra-axial collection or mass lesion. Pelvic CT revealed no acute fx. Labs revealed severe hypokalemia of 2.6.  Troponin 0.03 CK 428. Admitted for observation 01/19/19    PT Comments    Patient seen for mobility progression. Pt requires max-total A +2 for OOB transfer training due to heavy posterior bias. Pt oriented to self and place only. Continue to progress as tolerated with anticipated d/c to SNF for further skilled PT services.     Follow Up Recommendations  SNF     Equipment Recommendations  Other (comment)(TBD at next venue)    Recommendations for Other Services       Precautions / Restrictions Precautions Precautions: Fall Precaution Comments: fall prior to admit Restrictions Weight Bearing Restrictions: No    Mobility  Bed Mobility Overal bed mobility: Needs Assistance Bed Mobility: Supine to Sit     Supine to sit: Mod assist;HOB elevated     General bed mobility comments: cues for sequencing; pt brought bilat LE to EOB and requires assist to elevate trunk and scoot hips; use of bed rails   Transfers Overall transfer level: Needs assistance Equipment used: Rolling walker (2 wheeled) Transfers: Sit to/from [facility] Sit to Stand: Max assist;+2 physical assistance Stand pivot transfers: Max assist;+2 physical assistance;Total assist       General transfer comment: assistance required to power up into standing and then to maintain balance in standing due to heavy posterior bias and tendency to keep hips  flexed despite mulitmodal cues and facilitation for hip extension; pt takes steps forward with any attempt of upright posture and continues posterior lean; max A +2 initially for stand pivot EOB to recliner with assistance for balance and guiding RW however pt half way to chair and due to very narrow BOS, trunk flexion, and taking single hand off of RW total A +2 to follow through with safe transfer  Ambulation/Gait                 Stairs             Wheelchair Mobility    Modified Rankin (Stroke Patients Only)       Balance Overall balance assessment: Needs assistance Sitting-balance support: Feet supported;Bilateral upper extremity supported Sitting balance-Leahy Scale: Poor     Standing balance support: Bilateral upper extremity supported;During functional activity Standing balance-Leahy Scale: Zero                              Cognition Arousal/Alertness: Awake/alert Behavior During Therapy: WFL for tasks assessed/performed Overall Cognitive Status: No family/caregiver present to determine baseline cognitive functioning Area of Impairment: Orientation;Memory;Following commands;Safety/judgement;Problem solving                 Orientation Level: Person;Place(knows it's June but not able to recall year)   Memory: Decreased short-term memory(doesn't know why she is here) Following Commands: Follows one step commands with increased time;Follows one step commands consistently Safety/Judgement: Decreased awareness of safety;Decreased awareness of deficits   Problem Solving: Decreased initiation;Difficulty sequencing;Requires verbal cues;Requires tactile cues  General Comments: pt reports she was "working on the floor" prior to coming to hospital and asks "is that so weird?"      Exercises      General Comments General comments (skin integrity, edema, etc.): bowel movement while transfering (pt unaware) requiring total A for peri care in standing        Pertinent Vitals/Pain Pain Assessment: No/denies pain    Home Living                      Prior Function            PT Goals (current goals can now be found in the care plan section) Acute Rehab PT Goals Patient Stated Goal: none stated Progress towards PT goals: Progressing toward goals    Frequency    Min 2X/week      PT Plan Current plan remains appropriate    Co-evaluation              AM-PAC PT "6 Clicks" Mobility   Outcome Measure  Help needed turning from your back to your side while in a flat bed without using bedrails?: A Lot Help needed moving from lying on your back to sitting on the side of a flat bed without using bedrails?: A Lot Help needed moving to and from a bed to a chair (including a wheelchair)?: A Lot Help needed standing up from a chair using your arms (e.g., wheelchair or bedside chair)?: A Lot Help needed to walk in hospital room?: Total Help needed climbing 3-5 steps with a railing? : Total 6 Click Score: 10    End of Session Equipment Utilized During Treatment: Gait belt Activity Tolerance: Patient tolerated treatment well Patient left: with call bell/phone within reach;in chair;with chair alarm set Nurse Communication: Mobility status PT Visit Diagnosis: Unsteadiness on feet (R26.81);Other abnormalities of gait and mobility (R26.89);Muscle weakness (generalized) (M62.81);History of falling (Z91.81);Difficulty in walking, not elsewhere classified (R26.2)     Time: 6503-5465 PT Time Calculation (min) (ACUTE ONLY): 27 min  Charges:  $Gait Training: 23-37 mins                     Earney Navy, PTA Acute Rehabilitation Services Pager: [phone] Office: [phone]     Darliss Cheney 01/23/2019, 10:29 AM

## 2019-01-23 NOTE — Care Management Important Message (Signed)
Important Message  Patient Details  Name: Beverly Adkins MRN: [id number] Date of Birth: [birth year]   Medicare Important Message Given:  Yes     Orbie Pyo 01/23/2019, 12:23 PM

## 2019-01-23 NOTE — Plan of Care (Signed)

## 2019-01-23 NOTE — Progress Notes (Signed)
Pt's bladder is distended. Did a bladder scan, and volume was 999. RN stood pt up to attempt to help her urinate. PT did urinate. RN scanned pt again and pt still had 762 ml in her bladder. PT is firmly refusing the IN & Out cath. RN explained the importance of performing the [place]. RN has called pt's daughter to see if she can help. MD notified. Will continue to educate and attempt.

## 2019-01-23 NOTE — Plan of Care (Signed)

## 2019-01-23 NOTE — TOC Progression Note (Addendum)
Transition of Care [facility]) - Progression Note    Patient Details  Name: Beverly Adkins MRN: [id number] Date of Birth: Oct 04, 1924  [facility]) CM/SW [place], LCSW Phone Number: 01/23/2019, 3:31 PM  Clinical Narrative:  [facility] unable to offer a bed because there isn't much to skill her with her diagnosis. She is also only on two scheduled medications. No IV antibiotics. [facility] is reviewing referral.  4:18 pm: Both [facility] and [facility] offered beds. Discussed with daughter and she chose [facility]. Admissions coordinator is aware. COVID test done yesterday. Results still pending.  Expected Discharge Plan: [place] Barriers to Discharge: Continued Medical Work up  Expected Discharge Plan and Services Expected Discharge Plan: [place] In-house Referral: Clinical Social Work Discharge Planning Services: NA Post Acute Care Choice: [place] Living arrangements for the past 2 months: [place]                 DME Arranged: N/A DME Agency: NA       HH Arranged: NA [place] Agency: NA         Social Determinants of Health (SDOH) Interventions    Readmission Risk Interventions No flowsheet data found.

## 2019-01-23 NOTE — Progress Notes (Signed)
Progress Note    Beverly Adkins  ZJI:[id number] DOB: [birth year]  DOA: 01/19/2019 PCP: Shawna Clamp, MD    Brief Narrative:    Medical records reviewed and are as summarized below:  Beverly Adkins is an 85 y.o. female  with history of dementia, hypertension, chronic lower extremity edema COPD who has not been taking any of her medications was brought to the ER after patient was found on the floor in the bathroom of her living facility.  Per daughter patient has been living with her last 2 months since recent COVID pandemic and was placed back in the independent living  facility yesterday.  Family went to check on her today and was found on the floor confused more than normal. Multiple falls.  Found to have hypokalemia and urinary retention.    Assessment/Plan:   Principal Problem:   Near syncope Active Problems:   Hypokalemia   Dementia (HCC)  AMS with dementia-- different from baseline, had a reported fall -MRI negative for acute events, chronic changes noted -recent UTI-- urinalysis unremarkable and culture w/o growth  -chest x ray negative - B12/ammonia unremarkable -attempted echo but no windows available for appropriate views  Severe hypokalemia - cause not clear (no reported diarrhea-- not taking any medications at home) -replaced Mg as well  Chronic lower extremity edema  -appears at baseline  dementia  -previously was able to function in an ILF prior -may be mentally back to her baseline but physically still needs help  History of hypertension  -not on any medications (patient's choice) -add coreg  sclerotic changes seen in the CAT scan concerning for metastatic disease.   -discussed with family-- not keen on further work up which is reasonable  Urinary retention -I/O per -bladder scan -bowel regimen  Family Communication/Anticipated D/C date and plan/Code Status   DVT prophylaxis: Lovenox ordered. Code Status: Full Code.  Family Communication: spoke  with daughter 6/22 Disposition Plan: from [place], will need higher level of care upon d/c- SNF   Medical Consultants:    None.     Subjective:   Bladder scan showed > 999  Objective:    Vitals:   01/23/19 0012 01/23/19 0409 01/23/19 0803 01/23/19 1044  BP: 128/87 (!) 162/80 (!) 151/76 131/78  Pulse: 97 88 89 98  Resp: 16 16 17    Temp: 97.7 F (36.5 C) 97.9 F (36.6 C) 98.1 F (36.7 C)   TempSrc: Oral Oral Axillary   SpO2: 97% 96% 97%   Weight:      Height:        Intake/Output Summary (Last 24 hours) at 01/23/2019 1122 Last data filed at 01/23/2019 0501 Gross per 24 hour  Intake 150 ml  Output 50 ml  Net 100 ml   Filed Weights   01/19/19 1547  Weight: 43 kg    Exam: In chair- on 2L [place] Bladder distended A+Ox3 +LE edema with thickened skin alert   Data Reviewed:   I have personally reviewed following labs and imaging studies:  Labs: Labs show the following:   Basic Metabolic Panel: Recent Labs  Lab 01/19/19 1638 01/19/19 2354 01/20/19 0926 01/21/19 1019 01/23/19 0718  NA 139  --  142 140 142  K 2.6*  --  2.9* 3.9 4.3  CL 98  --  102 103 103  CO2 30  --  26 28 31   GLUCOSE 115*  --  74 195* 106*  BUN 25*  --  21 27* 31*  CREATININE 0.66  --  0.71 0.64 0.67  CALCIUM 8.7*  --  7.8* 8.4* 8.5*  MG  --  1.8  --   --   --    GFR Estimated Creatinine Clearance: 27.3 mL/min (by C-G formula based on SCr of 0.67 mg/dL). Liver Function Tests: Recent Labs  Lab 01/19/19 1638  AST 35  ALT 24  ALKPHOS 68  BILITOT 0.7  PROT 6.3*  ALBUMIN 3.6   No results for input(s): LIPASE, AMYLASE in the last 168 hours. Recent Labs  Lab 01/21/19 0554  AMMONIA 30   Coagulation profile No results for input(s): INR, PROTIME in the last 168 hours.  CBC: Recent Labs  Lab 01/19/19 1638 01/20/19 0926 01/23/19 0718  WBC 7.9 9.2 7.0  NEUTROABS 6.6  --   --   HGB 12.7 11.9* 12.2  HCT 40.0 38.1 38.7  MCV 88.9 89.4 89.8  PLT 233 253 245   Cardiac  Enzymes: Recent Labs  Lab 01/19/19 1919 01/19/19 2354 01/20/19 0926 01/20/19 1144  CKTOTAL 428*  --   --   --   CKMB 13.5*  --   --   --   TROPONINI  --  0.03* 0.03* <0.03   BNP (last 3 results) No results for input(s): PROBNP in the last 8760 hours. CBG: No results for input(s): GLUCAP in the last 168 hours. D-Dimer: No results for input(s): DDIMER in the last 72 hours. Hgb A1c: No results for input(s): HGBA1C in the last 72 hours. Lipid Profile: No results for input(s): CHOL, HDL, LDLCALC, TRIG, CHOLHDL, LDLDIRECT in the last 72 hours. Thyroid function studies: No results for input(s): TSH, T4TOTAL, T3FREE, THYROIDAB in the last 72 hours.  Invalid input(s): FREET3 Anemia work up: Recent Labs    01/21/19 [place] 904   Sepsis Labs: Recent Labs  Lab 01/19/19 1638 01/20/19 0926 01/23/19 0718  WBC 7.9 9.2 7.0    Microbiology Recent Results (from the past 240 hour(s))  SARS Coronavirus 2 (CEPHEID - Performed in [place] hospital lab), Hosp Order     Status: None   Collection Time: 01/19/19  7:27 PM   Specimen: Nasopharyngeal Swab  Result Value Ref Range Status   SARS Coronavirus 2 NEGATIVE NEGATIVE Final    Comment: (NOTE) If result is NEGATIVE SARS-CoV-2 target nucleic acids are NOT DETECTED. The SARS-CoV-2 RNA is generally detectable in upper and lower  respiratory specimens during the acute phase of infection. The lowest  concentration of SARS-CoV-2 viral copies this assay can detect is 250  copies / mL. A negative result does not preclude SARS-CoV-2 infection  and should not be used as the sole basis for treatment or other  patient management decisions.  A negative result may occur with  improper specimen collection / handling, submission of specimen other  than nasopharyngeal swab, presence of viral mutation(s) within the  areas targeted by this assay, and inadequate number of viral copies  (<250 copies / mL). A negative result must be combined  with clinical  observations, patient history, and epidemiological information. If result is POSITIVE SARS-CoV-2 target nucleic acids are DETECTED. The SARS-CoV-2 RNA is generally detectable in upper and lower  respiratory specimens dur ing the acute phase of infection.  Positive  results are indicative of active infection with SARS-CoV-2.  Clinical  correlation with patient history and other diagnostic information is  necessary to determine patient infection status.  Positive results do  not rule out bacterial infection or co-infection with other viruses. If result is PRESUMPTIVE POSTIVE SARS-CoV-2  nucleic acids MAY BE PRESENT.   A presumptive positive result was obtained on the submitted specimen  and confirmed on repeat testing.  While 2019 novel coronavirus  (SARS-CoV-2) nucleic acids may be present in the submitted sample  additional confirmatory testing may be necessary for epidemiological  and / or clinical management purposes  to differentiate between  SARS-CoV-2 and other Sarbecovirus currently known to infect humans.  If clinically indicated additional testing with an alternate test  methodology [phone]) is advised. The SARS-CoV-2 RNA is generally  detectable in upper and lower respiratory sp ecimens during the acute  phase of infection. The expected result is Negative. Fact Sheet for Patients:  StrictlyIdeas.no Fact Sheet for Healthcare Providers: BankingDealers.co.za This test is not yet approved or cleared by the [place] FDA and has been authorized for detection and/or diagnosis of SARS-CoV-2 by FDA under an Emergency Use Authorization (EUA).  This EUA will remain in effect (meaning this test can be used) for the duration of the COVID-19 declaration under Section 564(b)(1) of the Act, 21 U.S.C. section 360bbb-3(b)(1), unless the authorization is terminated or revoked sooner. Performed at [facility], [place] [address] [place], [place] [address]   Culture, Urine     Status: Abnormal   Collection Time: 01/20/19 10:56 AM   Specimen: Urine, Random  Result Value Ref Range Status   Specimen Description URINE, RANDOM  Final   Special Requests   Final    NONE Performed at [facility], [place] [address], [place], [place] [address]    Culture MULTIPLE SPECIES PRESENT, SUGGEST RECOLLECTION (A)  Final   Report Status 01/21/2019 FINAL  Final  MRSA PCR Screening     Status: None   Collection Time: 01/20/19 12:30 PM   Specimen: Nasopharyngeal  Result Value Ref Range Status   MRSA by PCR NEGATIVE NEGATIVE Final    Comment:        The GeneXpert MRSA Assay (FDA approved for NASAL specimens only), is one component of a comprehensive MRSA colonization surveillance program. It is not intended to diagnose MRSA infection nor to guide or monitor treatment for MRSA infections. Performed at [facility], [place] [address], [place], [place] [address]     Procedures and diagnostic studies:  No results found.  Medications:    carvedilol  3.125 mg Oral BID WC   enoxaparin (LOVENOX) injection  30 mg Subcutaneous Q24H   Continuous Infusions:    LOS: 3 days   Geradine Girt  [facility]   How to contact the [facility] Attending or Consulting provider [place] or covering provider during after hours [place], for this patient?  1. Check the care team in [facility] and look for a) attending/consulting [facility] provider listed and b) the [facility] team listed 2. Log into [email or web address] and use [place]'s universal password to access. If you do not have the password, please contact the hospital operator. 3. Locate the [facility] provider you are looking for under [facility] and page to a number that you can be directly reached. 4. If you still have difficulty reaching the provider, please page the [facility], [facility], [facility] (Director on Call) for the Hospitalists listed on amion for assistance.  01/23/2019, 11:22 AM

## 2019-01-23 NOTE — Progress Notes (Signed)
Pt's BP 162/80, Dr. Baltazar Najjar informed per order.

## 2019-01-24 LAB — NOVEL CORONAVIRUS, NAA (HOSP ORDER, SEND-OUT TO REF LAB; TAT 18-24 HRS): SARS-CoV-2, NAA: NOT DETECTED

## 2019-01-24 MED ORDER — CARVEDILOL 3.125 MG PO TABS: 3.1250 mg | ORAL_TABLET | Freq: Two times a day (BID) | ORAL | Status: AC

## 2019-01-24 MED ORDER — SENNOSIDES-DOCUSATE SODIUM 8.6-50 MG PO TABS: 1.0000 | ORAL_TABLET | Freq: Every day | ORAL | Status: AC | PRN

## 2019-01-24 MED ORDER — TAMSULOSIN HCL 0.4 MG PO CAPS: 0.4000 mg | ORAL_CAPSULE | Freq: Every day | ORAL | Status: DC

## 2019-01-24 MED ORDER — IPRATROPIUM-ALBUTEROL 0.5-2.5 (3) MG/3ML IN SOLN: 3.0000 mL | Freq: Four times a day (QID) | RESPIRATORY_TRACT | Status: AC | PRN

## 2019-01-24 MED ORDER — TAMSULOSIN HCL 0.4 MG PO CAPS: 0.4000 mg | ORAL_CAPSULE | Freq: Every day | ORAL | Status: AC

## 2019-01-24 NOTE — Plan of Care (Signed)
UOP minimal today. Bladder scan + 400-500. 16 Fr Foley inserted without difficulty. 450cc return noted. Report called to [facility].

## 2019-01-24 NOTE — TOC Transition Note (Signed)
Transition of Care [facility]) - CM/SW Discharge Note   Patient Details  Name: Beverly Adkins MRN: [id number] Date of Birth: 12-24-1924  [facility]) CM/SW Contact:  Candie Chroman, LCSW Phone Number: 01/24/2019, 1:01 PM   Clinical Narrative: SNF administrator said it's okay to go ahead and send patient with COVID results still pending since she already had one negative test from 6/19.  CSW facilitated patient discharge including contacting patient family and facility to confirm patient discharge plans. Clinical information faxed to facility and family agreeable with plan. CSW arranged ambulance transport via [facility] to [facility] at 2:00. RN to call report prior to discharge [phone]).  CSW will sign off for now as social work intervention is no longer needed. Please consult [place] again if new needs arise.  Final next level of care: Skilled Nursing Facility Barriers to Discharge: Barriers Resolved   Patient Goals and CMS Choice Patient states their goals for this hospitalization and ongoing recovery are:: Pt daughter is agreeable to her mother going to rehab CMS Medicare.gov Compare Post Acute Care list provided to:: Patient Represenative (must comment) Choice offered to / list presented to : Adult Children  Discharge Placement   Existing PASRR number confirmed : 01/20/19          Patient chooses bed at: [facility]) Patient to be transferred to facility by: [place] Name of family member notified: [name] Patient and family notified of of transfer: 01/24/19  Discharge Plan and Services In-house Referral: Clinical Social Work Discharge Planning Services: NA Post Acute Care Choice: [place]          DME Arranged: N/A DME Agency: NA       HH Arranged: NA HH Agency: NA        Social Determinants of Health (SDOH) Interventions     Readmission Risk Interventions No flowsheet data found.

## 2019-01-24 NOTE — Progress Notes (Signed)
Pt noted to have 572 cc via bladder scan. Dr. Baltazar Najjar placed 1x order for I&O cath. Pt refusing I&O cath at this time, "I just want to sleep, we talk about it later".

## 2019-01-24 NOTE — Discharge Summary (Signed)
Physician Discharge Summary  Deeanna Beightol DUK:[id number] DOB: August 02, 1925 DOA: 01/19/2019  PCP: Shawna Clamp, MD  Admit date: 01/19/2019 Discharge date: 01/24/2019  Admitted From: Independent living facility* Disposition: Skilled nursing facility  Recommendations for Outpatient Follow-up:  1. Follow up with PCP in 1-2 weeks 2. Please obtain voiding trial with Foley catheter removal in 3 to 5 days   Equipment/Devices: foley  Discharge Condition: Stable CODE STATUS: DNR Diet recommendation: Cardiac diet Brief/Interim Summary:  85 y.o. female  withhistory of dementia, hypertension, chronic lower extremity edema COPD who has not been taking any of her medications was brought to the ER after patient was found on the floor in the bathroom of her living facility. Per daughter patient has been living with her last 2 months since recent COVID pandemic and was placed back in the independent living  facility yesterday. Family went to check on her today and was found on the floor confused more than normal. Multiple falls.  Found to have hypokalemia and urinary retention.   At this time patient mental status stable, she is stable, she is getting discharged to skilled nursing facility.  Covid 19 negative and repeat form 6/22- pending  Assessment/Plan:   Discharge diagnoses  AMS with dementia-- different from baseline, had a reported fall -MRI negative for acute events, chronic changes noted -recent UTI-- urinalysis unremarkable and culture w/o growth  -chest x ray negative - B12/ammonia unremarkable -attempted echo but no windows available for appropriate views  Severe hypokalemia - cause not clear (no reported diarrhea-- not taking any medications at home) -replaced Mg as well no stable.  Chronic lower extremity edema  -appears at baseline  dementia  -previously was able to function in an ILF prior -may be mentally back to her baseline but physically still needs help cont  supportive care.  History of hypertension  -not on any medications (patient's choice) -added coreg  sclerotic changes seen in the CAT scan concerning for metastatic disease.  -discussed with family-- not keen on further work up which is reasonable  Urinary retention Patient has been retaining urine, so placed on Flomax at bedtime and also needed Foley catheter 6/23.  Continue Foley catheter for couple days and attempt trial of void at the skilled nursing facility.   Discharge Instructions  Discharge Instructions    Call MD for:  persistant nausea and vomiting   Complete by: As directed    Call MD for:  severe uncontrolled pain   Complete by: As directed    Call MD for:  temperature >100.4   Complete by: As directed    Diet - low sodium heart healthy   Complete by: As directed    Increase activity slowly   Complete by: As directed      Allergies as of 01/24/2019      Reactions   Tape Other (See Comments)   SKIN IS THIN AND WILL TEAR EASILY!!      Medication List    TAKE these medications   carvedilol 3.125 MG tablet Commonly known as: COREG Take 1 tablet (3.125 mg total) by mouth 2 (two) times daily with a meal.   ipratropium-albuterol 0.5-2.5 (3) MG/3ML Soln Commonly known as: DUONEB Take 3 mLs by nebulization every 6 (six) hours as needed.   OXYGEN Inhale 3 L into the lungs.   senna-docusate 8.6-50 MG tablet Commonly known as: Senokot-S Take 1 tablet by mouth daily as needed for mild constipation.   sorbitol 70 % Soln Take 20 mLs by mouth daily  as needed (CONSTIPATION).   tamsulosin 0.4 MG Caps capsule Commonly known as: FLOMAX Take 1 capsule (0.4 mg total) by mouth daily after supper.      Contact information for after-discharge care    Destination    [facility] .   Service: Skilled Nursing Contact information: [address] [place] [phone]              Allergies  Allergen Reactions  . Tape Other (See Comments)    SKIN IS THIN AND WILL TEAR EASILY!!    Procedures/Studies: Ct Head Wo Contrast  Result Date: 01/19/2019 CLINICAL DATA:  Altered mental status.  Patient found down. EXAM: CT HEAD WITHOUT CONTRAST CT CERVICAL SPINE WITHOUT CONTRAST TECHNIQUE: Multidetector CT imaging of the head and cervical spine was performed following the standard protocol without intravenous contrast. Multiplanar CT image reconstructions of the cervical spine were also generated. COMPARISON:  Head CT 12/04/2018 FINDINGS: CT HEAD FINDINGS Brain: Stable age related cerebral atrophy, ventriculomegaly and periventricular white matter disease. No extra-axial fluid collections are identified. No CT findings for acute hemispheric infarction or intracranial hemorrhage. No mass lesions. The brainstem and cerebellum are normal. Vascular: Stable vascular calcifications. No definite aneurysm or hyperdense vessels. Skull: No skull fracture or bone lesions. Sinuses/Orbits: Right mastoid effusions are noted. The left mastoid air cells are clear. The paranasal sinuses are clear. Other: No scalp lesions or scalp hematoma. CT CERVICAL SPINE FINDINGS Alignment: Grossly normal overall alignment. There is degenerative cervical spondylosis with multilevel disc disease and facet disease and multilevel degenerative subluxations. Skull base and vertebrae: Advanced degenerative changes at C1-2 with pannus formation. There are compression deformities of T1 and T3 with underlying findings suspicious for mixed lytic and sclerotic bone disease. Other sclerotic lesions are also noted and findings suspicious for metastatic disease. Does this patient have known primary? I do not have any prior studies for comparison but I suspect the compression fractures are remote. I do not see any obvious paraspinal tumor or hematoma. Soft tissues and spinal canal: No abnormal prevertebral soft tissue swelling. No  obvious spinal hematoma. Disc levels: The spinal canal is fairly generous. No significant spinal stenosis. There is mild retropulsion of the T3 vertebral body noted. Upper chest: The lung apices are grossly clear. Other: Vascular calcifications but no neck mass or adenopathy. IMPRESSION: 1. Stable age related cerebral atrophy, ventriculomegaly and periventricular white matter disease. No acute intracranial findings or mass lesions. 2. Advanced degenerative cervical spondylosis with multilevel disc disease and facet disease. 3. Compression deformities of T1 and T3 of uncertain age. Mild retropulsion at T3. 4. Sclerotic changes suspicious for metastatic disease. Recommend clinical correlation. Electronically Signed   By: Marijo Sanes M.D.   On: 01/19/2019 18:01   Ct Cervical Spine Wo Contrast  Result Date: 01/19/2019 CLINICAL DATA:  Altered mental status.  Patient found down. EXAM: CT HEAD WITHOUT CONTRAST CT CERVICAL SPINE WITHOUT CONTRAST TECHNIQUE: Multidetector CT imaging of the head and cervical spine was performed following the standard protocol without intravenous contrast. Multiplanar CT image reconstructions of the cervical spine were also generated. COMPARISON:  Head CT 12/04/2018 FINDINGS: CT HEAD FINDINGS Brain: Stable age related cerebral atrophy, ventriculomegaly and periventricular white matter disease. No extra-axial fluid collections are identified. No CT findings for acute hemispheric infarction or intracranial hemorrhage. No mass lesions. The brainstem and cerebellum are normal. Vascular: Stable vascular calcifications. No definite aneurysm or hyperdense vessels. Skull: No skull fracture or bone lesions.  Sinuses/Orbits: Right mastoid effusions are noted. The left mastoid air cells are clear. The paranasal sinuses are clear. Other: No scalp lesions or scalp hematoma. CT CERVICAL SPINE FINDINGS Alignment: Grossly normal overall alignment. There is degenerative cervical spondylosis with multilevel  disc disease and facet disease and multilevel degenerative subluxations. Skull base and vertebrae: Advanced degenerative changes at C1-2 with pannus formation. There are compression deformities of T1 and T3 with underlying findings suspicious for mixed lytic and sclerotic bone disease. Other sclerotic lesions are also noted and findings suspicious for metastatic disease. Does this patient have known primary? I do not have any prior studies for comparison but I suspect the compression fractures are remote. I do not see any obvious paraspinal tumor or hematoma. Soft tissues and spinal canal: No abnormal prevertebral soft tissue swelling. No obvious spinal hematoma. Disc levels: The spinal canal is fairly generous. No significant spinal stenosis. There is mild retropulsion of the T3 vertebral body noted. Upper chest: The lung apices are grossly clear. Other: Vascular calcifications but no neck mass or adenopathy. IMPRESSION: 1. Stable age related cerebral atrophy, ventriculomegaly and periventricular white matter disease. No acute intracranial findings or mass lesions. 2. Advanced degenerative cervical spondylosis with multilevel disc disease and facet disease. 3. Compression deformities of T1 and T3 of uncertain age. Mild retropulsion at T3. 4. Sclerotic changes suspicious for metastatic disease. Recommend clinical correlation. Electronically Signed   By: Marijo Sanes M.D.   On: 01/19/2019 18:01   Mr Brain Wo Contrast  Result Date: 01/20/2019 CLINICAL DATA:  85 y/o  F; generalized weakness and confusion. EXAM: MRI HEAD WITHOUT CONTRAST TECHNIQUE: Multiplanar, multiecho pulse sequences of the brain and surrounding structures were obtained without intravenous contrast. COMPARISON:  01/19/2019 CT head FINDINGS: Brain: No acute infarction, hemorrhage, hydrocephalus, extra-axial collection or mass lesion. Stable mild chronic microvascular ischemic changes and volume loss of the brain for age. Vascular: Normal flow  voids. Skull and upper cervical spine: Advanced spondylosis of the cervical spine and prominent retro odontoid pannus. Calvarial bone marrow is unremarkable. Sinuses/Orbits: Partial opacification of the right mastoid air cells. Included paranasal sinuses and the left mastoid air cells demonstrate normal signal. Bilateral intra-ocular lens replacement. Other: None. IMPRESSION: 1. No acute intracranial abnormality identified. 2. Stable mild chronic microvascular ischemic changes and volume loss of the brain for age. Electronically Signed   By: Kristine Garbe M.D.   On: 01/20/2019 04:24   Dg Pelvis Portable  Result Date: 01/20/2019 CLINICAL DATA:  Fall EXAM: PORTABLE PELVIS 1-2 VIEWS COMPARISON:  None. FINDINGS: Partial visualization of fixation hardware in the proximal right femur, with no evidence of hardware fracture or loosening. No evidence of hip dislocation on this single frontal view. No evidence of acute pelvic fracture or diastasis. There is periosteal reaction along the superior pubic rami bilaterally. No suspicious focal osseous lesions. IMPRESSION: No acute pelvic fracture or diastasis. Nonspecific periosteal reaction along the superior pubic rami bilaterally, cannot exclude subacute healing superior pubic rami fractures. Electronically Signed   By: Ilona Sorrel M.D.   On: 01/20/2019 06:30   Dg Chest Port 1 View  Result Date: 01/20/2019 CLINICAL DATA:  Altered mental status EXAM: PORTABLE CHEST 1 VIEW COMPARISON:  12/04/2018 FINDINGS: Marked elevation of the LEFT hemidiaphragm and mediastinal shift to the RIGHT is unchanged. Mild LEFT LOWER lung atelectasis/scarring again noted. There is no evidence of focal airspace disease, pulmonary edema, suspicious pulmonary nodule/mass, pleural effusion, or pneumothorax. No acute bony abnormalities are identified. IMPRESSION: No active disease. Electronically Signed  By: Margarette Canada M.D.   On: 01/20/2019 12:08    Subjective: Resting  comfortably.  Has been having urine retention and Foley is being inserted today.  Discharge Exam: Vitals:   01/24/19 0805 01/24/19 1138  BP: (!) 142/88 (!) 107/92  Pulse: 78 82  Resp: 18 18  Temp: 98 F (36.7 C) 98.3 F (36.8 C)  SpO2: 100% 92%   Vitals:   01/24/19 0015 01/24/19 0354 01/24/19 0805 01/24/19 1138  BP: (!) 149/85 (!) 158/71 (!) 142/88 (!) 107/92  Pulse: 85 74 78 82  Resp: 16 16 18 18   Temp: (!) 97.5 F (36.4 C) 97.9 F (36.6 C) 98 F (36.7 C) 98.3 F (36.8 C)  TempSrc: Oral Oral Oral Oral  SpO2: 100% 100% 100% 92%  Weight:      Height:        General: Pt is alert, awake, not in acute distress Cardiovascular: RRR, S1/S2 +, no rubs, no gallops Respiratory: CTA bilaterally, no wheezing, no rhonchi Abdominal: Soft, NT, ND, bowel sounds + Extremities: no edema, no cyanosis   The results of significant diagnostics from this hospitalization (including imaging, microbiology, ancillary and laboratory) are listed below for reference.     Microbiology: Recent Results (from the past 240 hour(s))  SARS Coronavirus 2 (CEPHEID - Performed in [place] hospital lab), Hosp Order     Status: None   Collection Time: 01/19/19  7:27 PM   Specimen: Nasopharyngeal Swab  Result Value Ref Range Status   SARS Coronavirus 2 NEGATIVE NEGATIVE Final    Comment: (NOTE) If result is NEGATIVE SARS-CoV-2 target nucleic acids are NOT DETECTED. The SARS-CoV-2 RNA is generally detectable in upper and lower  respiratory specimens during the acute phase of infection. The lowest  concentration of SARS-CoV-2 viral copies this assay can detect is 250  copies / mL. A negative result does not preclude SARS-CoV-2 infection  and should not be used as the sole basis for treatment or other  patient management decisions.  A negative result may occur with  improper specimen collection / handling, submission of specimen other  than nasopharyngeal swab, presence of viral mutation(s) within the   areas targeted by this assay, and inadequate number of viral copies  (<250 copies / mL). A negative result must be combined with clinical  observations, patient history, and epidemiological information. If result is POSITIVE SARS-CoV-2 target nucleic acids are DETECTED. The SARS-CoV-2 RNA is generally detectable in upper and lower  respiratory specimens dur ing the acute phase of infection.  Positive  results are indicative of active infection with SARS-CoV-2.  Clinical  correlation with patient history and other diagnostic information is  necessary to determine patient infection status.  Positive results do  not rule out bacterial infection or co-infection with other viruses. If result is PRESUMPTIVE POSTIVE SARS-CoV-2 nucleic acids MAY BE PRESENT.   A presumptive positive result was obtained on the submitted specimen  and confirmed on repeat testing.  While 2019 novel coronavirus  (SARS-CoV-2) nucleic acids may be present in the submitted sample  additional confirmatory testing may be necessary for epidemiological  and / or clinical management purposes  to differentiate between  SARS-CoV-2 and other Sarbecovirus currently known to infect humans.  If clinically indicated additional testing with an alternate test  methodology [id number]) is advised. The SARS-CoV-2 RNA is generally  detectable in upper and lower respiratory sp ecimens during the acute  phase of infection. The expected result is Negative. Fact Sheet for Patients:  StrictlyIdeas.no  Fact Sheet for Healthcare Providers: BankingDealers.co.za This test is not yet approved or cleared by the [place] FDA and has been authorized for detection and/or diagnosis of SARS-CoV-2 by FDA under an Emergency Use Authorization (EUA).  This EUA will remain in effect (meaning this test can be used) for the duration of the COVID-19 declaration under Section 564(b)(1) of the Act, 21  U.S.C. section 360bbb-3(b)(1), unless the authorization is terminated or revoked sooner. Performed at [facility], [place] [address], [place], [place] [address]   Culture, Urine     Status: Abnormal   Collection Time: 01/20/19 10:56 AM   Specimen: Urine, Random  Result Value Ref Range Status   Specimen Description URINE, RANDOM  Final   Special Requests   Final    NONE Performed at [facility], [place] [address], [place], [place] [address]    Culture MULTIPLE SPECIES PRESENT, SUGGEST RECOLLECTION (A)  Final   Report Status 01/21/2019 FINAL  Final  MRSA PCR Screening     Status: None   Collection Time: 01/20/19 12:30 PM   Specimen: Nasopharyngeal  Result Value Ref Range Status   MRSA by PCR NEGATIVE NEGATIVE Final    Comment:        The GeneXpert MRSA Assay (FDA approved for NASAL specimens only), is one component of a comprehensive MRSA colonization surveillance program. It is not intended to diagnose MRSA infection nor to guide or monitor treatment for MRSA infections. Performed at [facility], [place] [address], [place], [place] [address]      Labs: BNP (last 3 results) Recent Labs    12/04/18 1555  BNP 97.9   Basic Metabolic Panel: Recent Labs  Lab 01/19/19 1638 01/19/19 2354 01/20/19 0926 01/21/19 1019 01/23/19 0718  NA 139  --  142 140 142  K 2.6*  --  2.9* 3.9 4.3  CL 98  --  102 103 103  CO2 30  --  26 28 31   GLUCOSE 115*  --  74 195* 106*  BUN 25*  --  21 27* 31*  CREATININE 0.66  --  0.71 0.64 0.67  CALCIUM 8.7*  --  7.8* 8.4* 8.5*  MG  --  1.8  --   --   --    Liver Function Tests: Recent Labs  Lab 01/19/19 1638  AST 35  ALT 24  ALKPHOS 68  BILITOT 0.7  PROT 6.3*  ALBUMIN 3.6   No results for input(s): LIPASE, AMYLASE in the last 168 hours. Recent Labs  Lab 01/21/19 0554  AMMONIA 30   CBC: Recent Labs  Lab 01/19/19 1638 01/20/19 0926 01/23/19 0718  WBC 7.9 9.2 7.0  NEUTROABS 6.6  --   --   HGB 12.7 11.9* 12.2   HCT 40.0 38.1 38.7  MCV 88.9 89.4 89.8  PLT 233 253 245   Cardiac Enzymes: Recent Labs  Lab 01/19/19 1919 01/19/19 2354 01/20/19 0926 01/20/19 1144  CKTOTAL 428*  --   --   --   CKMB 13.5*  --   --   --   TROPONINI  --  0.03* 0.03* <0.03   BNP: Invalid input(s): POCBNP CBG: No results for input(s): GLUCAP in the last 168 hours. D-Dimer No results for input(s): DDIMER in the last 72 hours. Hgb A1c No results for input(s): HGBA1C in the last 72 hours. Lipid Profile No results for input(s): CHOL, HDL, LDLCALC, TRIG, CHOLHDL, LDLDIRECT in the last 72 hours. Thyroid function studies No results  for input(s): TSH, T4TOTAL, T3FREE, THYROIDAB in the last 72 hours.  Invalid input(s): FREET3 Anemia work up No results for input(s): VITAMINB12, FOLATE, FERRITIN, TIBC, IRON, RETICCTPCT in the last 72 hours. Urinalysis    Component Value Date/Time   COLORURINE YELLOW 01/19/2019 1800   APPEARANCEUR HAZY (A) 01/19/2019 1800   LABSPEC 1.012 01/19/2019 1800   PHURINE 8.0 01/19/2019 1800   GLUCOSEU NEGATIVE 01/19/2019 1800   HGBUR NEGATIVE 01/19/2019 1800   BILIRUBINUR NEGATIVE 01/19/2019 1800   KETONESUR 20 (A) 01/19/2019 1800   PROTEINUR 30 (A) 01/19/2019 1800   UROBILINOGEN 0.2 10/19/2013 2012   NITRITE NEGATIVE 01/19/2019 1800   LEUKOCYTESUR NEGATIVE 01/19/2019 1800   Sepsis Labs Invalid input(s): PROCALCITONIN,  WBC,  LACTICIDVEN Microbiology Recent Results (from the past 240 hour(s))  SARS Coronavirus 2 (CEPHEID - Performed in [place] hospital lab), Hosp Order     Status: None   Collection Time: 01/19/19  7:27 PM   Specimen: Nasopharyngeal Swab  Result Value Ref Range Status   SARS Coronavirus 2 NEGATIVE NEGATIVE Final    Comment: (NOTE) If result is NEGATIVE SARS-CoV-2 target nucleic acids are NOT DETECTED. The SARS-CoV-2 RNA is generally detectable in upper and lower  respiratory specimens during the acute phase of infection. The lowest  concentration of  SARS-CoV-2 viral copies this assay can detect is 250  copies / mL. A negative result does not preclude SARS-CoV-2 infection  and should not be used as the sole basis for treatment or other  patient management decisions.  A negative result may occur with  improper specimen collection / handling, submission of specimen other  than nasopharyngeal swab, presence of viral mutation(s) within the  areas targeted by this assay, and inadequate number of viral copies  (<250 copies / mL). A negative result must be combined with clinical  observations, patient history, and epidemiological information. If result is POSITIVE SARS-CoV-2 target nucleic acids are DETECTED. The SARS-CoV-2 RNA is generally detectable in upper and lower  respiratory specimens dur ing the acute phase of infection.  Positive  results are indicative of active infection with SARS-CoV-2.  Clinical  correlation with patient history and other diagnostic information is  necessary to determine patient infection status.  Positive results do  not rule out bacterial infection or co-infection with other viruses. If result is PRESUMPTIVE POSTIVE SARS-CoV-2 nucleic acids MAY BE PRESENT.   A presumptive positive result was obtained on the submitted specimen  and confirmed on repeat testing.  While 2019 novel coronavirus  (SARS-CoV-2) nucleic acids may be present in the submitted sample  additional confirmatory testing may be necessary for epidemiological  and / or clinical management purposes  to differentiate between  SARS-CoV-2 and other Sarbecovirus currently known to infect humans.  If clinically indicated additional testing with an alternate test  methodology [phone]) is advised. The SARS-CoV-2 RNA is generally  detectable in upper and lower respiratory sp ecimens during the acute  phase of infection. The expected result is Negative. Fact Sheet for Patients:  StrictlyIdeas.no Fact Sheet for Healthcare  Providers: BankingDealers.co.za This test is not yet approved or cleared by the [place] FDA and has been authorized for detection and/or diagnosis of SARS-CoV-2 by FDA under an Emergency Use Authorization (EUA).  This EUA will remain in effect (meaning this test can be used) for the duration of the COVID-19 declaration under Section 564(b)(1) of the Act, 21 U.S.C. section 360bbb-3(b)(1), unless the authorization is terminated or revoked sooner. Performed at [facility], [address] [place], [place] [address]   Culture, Urine     Status: Abnormal   Collection Time: 01/20/19 10:56 AM   Specimen: Urine, Random  Result Value Ref Range Status   Specimen Description URINE, RANDOM  Final   Special Requests   Final    NONE Performed at [facility], [place] [address] [place], [place] [address]    Culture MULTIPLE SPECIES PRESENT, SUGGEST RECOLLECTION (A)  Final   Report Status 01/21/2019 FINAL  Final  MRSA PCR Screening     Status: None   Collection Time: 01/20/19 12:30 PM   Specimen: Nasopharyngeal  Result Value Ref Range Status   MRSA by PCR NEGATIVE NEGATIVE Final    Comment:        The GeneXpert MRSA Assay (FDA approved for NASAL specimens only), is one component of a comprehensive MRSA colonization surveillance program. It is not intended to diagnose MRSA infection nor to guide or monitor treatment for MRSA infections. Performed at [facility], [place] [address] [place], [place] [address]      Time coordinating discharge: 25 minutes  SIGNED:   Antonieta Pert, MD  [facility] 01/24/2019, 12:35 PM  If 7PM-7AM, please contact night-coverage [email or web address]

## 2019-02-22 ENCOUNTER — Other Ambulatory Visit: Payer: Self-pay

## 2019-02-22 ENCOUNTER — Ambulatory Visit (INDEPENDENT_AMBULATORY_CARE_PROVIDER_SITE_OTHER): Payer: Medicare Other | Admitting: Internal Medicine

## 2019-02-22 DIAGNOSIS — J439 Emphysema, unspecified: Secondary | ICD-10-CM | POA: Diagnosis not present

## 2019-02-22 DIAGNOSIS — M159 Polyosteoarthritis, unspecified: Secondary | ICD-10-CM

## 2019-02-22 DIAGNOSIS — J449 Chronic obstructive pulmonary disease, unspecified: Secondary | ICD-10-CM | POA: Insufficient documentation

## 2019-02-22 DIAGNOSIS — M272 Inflammatory conditions of jaws: Secondary | ICD-10-CM | POA: Diagnosis present

## 2019-02-22 DIAGNOSIS — R296 Repeated falls: Secondary | ICD-10-CM | POA: Diagnosis not present

## 2019-02-22 DIAGNOSIS — M150 Primary generalized (osteo)arthritis: Secondary | ICD-10-CM | POA: Diagnosis not present

## 2019-02-22 DIAGNOSIS — M1990 Unspecified osteoarthritis, unspecified site: Secondary | ICD-10-CM | POA: Insufficient documentation

## 2019-02-22 NOTE — Progress Notes (Signed)
[facility] for [facility]ious [facility]  Reason for Consult: Possible right mandibular osteomyelitis Referring Provider: Irene Limbo, NP  Assessment: Based on the data available to me I cannot be absolutely certain if the x-ray abnormalities represent osteonecrosis secondary to Prolia, osteomyelitis or both but I certainly agree with the concern for infection.  When I discussed treatment options with Ms. Boggan she became very upset when I told her that first-line therapy is a course of IV antibiotics for 4 to 6 weeks.  I also discussed treatment options with her daughter by phone.  I told her that when IV antibiotic therapy is not practical we then try longer courses of oral antibiotics.  Her daughter mentioned that Ms. Greenup had been planning on moving from her skilled nursing facility to assisted living today and that if she stays on IV antibiotics at the skilled nursing facility they will have to pay to hold her place in assisted living.  She feels that this is financially untenable.  She is also concerned about her mother's emotional state and feels that allowing her to move to assisted living soon would be much preferable.  I also note that blood work here today showed that her inflammatory markers (sed rate and C-reactive protein) were both normal.  Based on all of these factors I suggest that we have the PICC line removed and change her to Augmentin 500 mg twice daily and plan on treatment for 6 weeks through 04/06/2019.  Plan: 1. Recommend PICC removal 2. Discontinue IV ceftriaxone 3. Start Augmentin 500 mg twice daily through 04/06/2019 4. Follow-up here on 04/23/2019 at 11 AM  Patient Active Problem List   Diagnosis Date Noted  . Osteomyelitis of mandible 02/22/2019    Priority: High  . Recurrent falls 02/23/2019  . COPD (chronic obstructive pulmonary disease) ([place]) 02/22/2019  . DJD (degenerative joint disease) 02/22/2019  . Near syncope 01/19/2019  . Dementia ([place])  01/19/2019  . Constipation 10/18/2011  . Fracture lumbar vertebra-closed ([place]) 10/18/2011  . 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 Anxiety   . Aphthous ulcer   . Carotid artery stenosis   . GERD (gastroesophageal reflux disease)   . Osteoporosis   . Peripheral neuropathy   . Incontinence of urine     Patient's Medications  New Prescriptions   No medications on file  Previous Medications   CARVEDILOL (COREG) 3.125 MG TABLET    Take 1 tablet (3.125 mg total) by mouth 2 (two) times daily with a meal.   CEFTRIAXONE (ROCEPHIN) IVPB    Inject 1 g into the vein daily.   IPRATROPIUM-ALBUTEROL (DUONEB) 0.5-2.5 (3) MG/3ML SOLN    Take 3 mLs by nebulization every 6 (six) hours as needed.   OXYGEN    Inhale 3 L into the lungs.   SENNA-DOCUSATE (SENOKOT-S) 8.6-50 MG TABLET    Take 1 tablet by mouth daily as needed for mild constipation.   SORBITOL 70 % SOLN    Take 20 mLs by mouth daily as needed (CONSTIPATION).   TAMSULOSIN (FLOMAX) 0.4 MG CAPS CAPSULE    Take 1 capsule (0.4 mg total) by mouth daily after supper.  Modified Medications   No medications on file  Discontinued Medications   No medications on file    HPI: Beverly Adkins is a 85 y.o. female who began to have difficulty opening her mouth widely in early May.  She also developed some redness and discomfort on the right side of her jaw.  She was treated with Advil with only partial relief.  She saw her dentist who referred her to her oral surgeon, Dr. Berniece Pap on 02/13/2019.  His handwritten note stated that x-rays revealed a moth-eaten appearance of right posterior mandible compatible with osteonecrosis.  She is taking Prolia for osteoporosis.  This is not mentioned in Dr. Julien Girt note but Ms. Mick's daughter, [name], told me that he was also very concerned about the possibility of bone infection related to a prior tooth extraction 2 years ago.  He called and spoke with the provider at her skilled nursing facility and  recommended IV antibiotics.  A PICC line was placed and she started on ceftriaxone shortly after that visit.  Ms. Shifflett tells me that she is better today.  Review of Systems: Review of Systems  Unable to perform ROS: Dementia      Past Medical History:  Diagnosis Date  . Abrasion of cornea, left   . Anal ulcer   . Anxiety   . Aphthous ulcer   . Carotid artery stenosis   . COPD (chronic obstructive pulmonary disease) ([place])   . Elevated hemidiaphragm    Left  . Fracture of femur, shaft ([place])    closed, Right  . GERD (gastroesophageal reflux disease)   . Hyperkalemia   . Incontinence of urine    urge  . Osteoarthritis   . Osteoporosis   . Peripheral neuropathy     Social History   Tobacco Use  . Smoking status: Never Smoker  . Smokeless tobacco: Never Used  Substance Use Topics  . Alcohol use: No  . Drug use: No    Family History  Problem Relation Age of Onset  . Alzheimer's disease Other   . Diabetes type II Other   . Hypertension Other   . Mental retardation Other   . Osteoporosis Other   . Parkinsonism Other   . Cancer Other   . Heart disease Other    Allergies  Allergen Reactions  . Tape Other (See Comments)    SKIN IS THIN AND WILL TEAR EASILY!!    OBJECTIVE: There were no vitals filed for this visit. There is no height or weight on file to calculate BMI.   Physical Exam Constitutional:      Comments: She is very pleasant and in no distress.  She is unable to provide meaningful history because of her dementia.  She is seated in a wheelchair.  HENT:     Mouth/Throat:     Pharynx: No oropharyngeal exudate.     Comments: She is able to open her mouth widely without any discomfort.  There is no unusual redness or swelling over her right jaw.  1 of her right mandibular molars has been removed.  I see no swelling along the gumline.   Lab Results  Component Value Date   WBC 4.3 02/22/2019   HGB 10.4 (L) 02/22/2019   HCT 32.1 (L) 02/22/2019   MCV  87.0 02/22/2019   PLT 183 02/22/2019   BMET    Component Value Date/Time   NA 140 02/22/2019 1434   K 3.8 02/22/2019 1434   CL 95 (L) 02/22/2019 1434   CO2 36 (H) 02/22/2019 1434   GLUCOSE 115 (H) 02/22/2019 1434   BUN 19 02/22/2019 1434   CREATININE 0.75 02/22/2019 1434   CALCIUM 8.4 (L) 02/22/2019 1434   GFRNONAA >60 01/23/2019 0718   GFRAA >60 01/23/2019 0718   Sed Rate (mm/h)  Date Value  02/22/2019 9  CRP (mg/L)  Date Value  02/22/2019 3.5    Microbiology: No results found for this or any previous visit (from the past 240 hour(s)).  Michel Bickers, MD [facility] [phone] pager   [phone] cell 02/23/2019, 10:26 AM

## 2019-02-23 DIAGNOSIS — R296 Repeated falls: Secondary | ICD-10-CM | POA: Insufficient documentation

## 2019-02-23 LAB — CBC
HCT: 32.1 % — ABNORMAL LOW (ref 35.0–45.0)
Hemoglobin: 10.4 g/dL — ABNORMAL LOW (ref 11.7–15.5)
MCH: 28.2 pg (ref 27.0–33.0)
MCHC: 32.4 g/dL (ref 32.0–36.0)
MCV: 87 fL (ref 80.0–100.0)
MPV: 12 fL (ref 7.5–12.5)
Platelets: 183 10*3/uL (ref 140–400)
RBC: 3.69 10*6/uL — ABNORMAL LOW (ref 3.80–5.10)
RDW: 14 % (ref 11.0–15.0)
WBC: 4.3 10*3/uL (ref 3.8–10.8)

## 2019-02-23 LAB — BASIC METABOLIC PANEL
BUN: 19 mg/dL (ref 7–25)
CO2: 36 mmol/L — ABNORMAL HIGH (ref 20–32)
Calcium: 8.4 mg/dL — ABNORMAL LOW (ref 8.6–10.4)
Chloride: 95 mmol/L — ABNORMAL LOW (ref 98–110)
Creat: 0.75 mg/dL (ref 0.60–0.88)
Glucose, Bld: 115 mg/dL — ABNORMAL HIGH (ref 65–99)
Potassium: 3.8 mmol/L (ref 3.5–5.3)
Sodium: 140 mmol/L (ref 135–146)

## 2019-02-23 LAB — SEDIMENTATION RATE: Sed Rate: 9 mm/h (ref 0–30)

## 2019-02-23 LAB — C-REACTIVE PROTEIN: CRP: 3.5 mg/L (ref <8.0)

## 2019-04-16 ENCOUNTER — Telehealth: Payer: Self-pay

## 2019-04-16 NOTE — Telephone Encounter (Signed)
NOTES ON FILE FROM [facility] [phone], SENT REFERRAL TO [place]

## 2019-04-18 ENCOUNTER — Ambulatory Visit: Payer: Medicare Other | Admitting: Internal Medicine

## 2019-04-23 ENCOUNTER — Other Ambulatory Visit (HOSPITAL_COMMUNITY): Payer: Self-pay | Admitting: Psychiatry

## 2019-04-23 ENCOUNTER — Encounter: Payer: Self-pay | Admitting: Internal Medicine

## 2019-04-23 ENCOUNTER — Ambulatory Visit (INDEPENDENT_AMBULATORY_CARE_PROVIDER_SITE_OTHER): Payer: Medicare Other | Admitting: Internal Medicine

## 2019-04-23 ENCOUNTER — Other Ambulatory Visit: Payer: Self-pay

## 2019-04-23 ENCOUNTER — Other Ambulatory Visit (HOSPITAL_COMMUNITY): Payer: Self-pay | Admitting: Adult Health

## 2019-04-23 DIAGNOSIS — I499 Cardiac arrhythmia, unspecified: Secondary | ICD-10-CM

## 2019-04-23 DIAGNOSIS — M272 Inflammatory conditions of jaws: Secondary | ICD-10-CM

## 2019-04-23 NOTE — Assessment & Plan Note (Signed)
I am hopeful that her infection has been cured.  I recommend continued observation off of antibiotics.  She can follow-up here as needed.

## 2019-04-23 NOTE — Progress Notes (Signed)
[facility] for Infectious Disease  Patient Active Problem List   Diagnosis Date Noted  . Osteomyelitis of mandible 02/22/2019    Priority: High  . Recurrent falls 02/23/2019  . COPD (chronic obstructive pulmonary disease) ([place]) 02/22/2019  . DJD (degenerative joint disease) 02/22/2019  . Near syncope 01/19/2019  . Dementia ([place]) 01/19/2019  . Constipation 10/18/2011  . Fracture lumbar vertebra-closed ([place]) 10/18/2011  . 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 Anxiety   . Aphthous ulcer   . Carotid artery stenosis   . GERD (gastroesophageal reflux disease)   . Osteoporosis   . Peripheral neuropathy   . Incontinence of urine     Patient's Medications  New Prescriptions   No medications on file  Previous Medications   ACIDOPHILUS (RISAQUAD) CAPS CAPSULE    Take 1 capsule by mouth daily.   CARVEDILOL (COREG) 3.125 MG TABLET    Take 1 tablet (3.125 mg total) by mouth 2 (two) times daily with a meal.   CHLORHEXIDINE (PERIDEX) 0.12 % SOLUTION       FUROSEMIDE (LASIX) 40 MG TABLET       IPRATROPIUM-ALBUTEROL (DUONEB) 0.5-2.5 (3) MG/3ML SOLN    Take 3 mLs by nebulization every 6 (six) hours as needed.   OXYGEN    Inhale 3 L into the lungs.   SENNA-DOCUSATE (SENOKOT-S) 8.6-50 MG TABLET    Take 1 tablet by mouth daily as needed for mild constipation.   SORBITOL 70 % SOLN    Take 20 mLs by mouth daily as needed (CONSTIPATION).   SYMBICORT 160-4.5 MCG/ACT INHALER       TAMSULOSIN (FLOMAX) 0.4 MG CAPS CAPSULE    Take 1 capsule (0.4 mg total) by mouth daily after supper.   ZINC GLUCONATE 50 MG TABLET    Take 50 mg by mouth daily.  Modified Medications   No medications on file  Discontinued Medications   No medications on file    Subjective: Ms. Orris is in for her follow-up visit.  She began to have difficulty opening her mouth widely in early May.  She also developed some redness and discomfort on the right side of her jaw.  She was treated with Advil with only  partial relief.  She saw her dentist who referred her to her oral surgeon, Dr. Berniece Pap on 02/13/2019.  His handwritten note stated that x-rays revealed a moth-eaten appearance of right posterior mandible compatible with osteonecrosis.  She is taking Prolia for osteoporosis.  This is not mentioned in Dr. Julien Girt note but Ms. Goldner's daughter, [name], told me that he was also very concerned about the possibility of bone infection related to a prior tooth extraction 2 years ago.  He called and spoke with the provider at her skilled nursing facility and recommended IV antibiotics.  A PICC line was placed and she started on ceftriaxone shortly after that visit.    When I saw her  she became very upset when I told her that first-line therapy for mandibular osteomyelitis is a course of IV antibiotics for 4 to 6 weeks.  I also discussed treatment options with her daughter by phone.  I told her that when IV antibiotic therapy is not practical we then try longer courses of oral antibiotics.  Her daughter mentioned that Ms. Carlberg had been planning on moving from her skilled nursing facility to assisted living today and that if she stays on IV antibiotics at the skilled nursing facility they will have  to pay to hold her place in assisted living. She felt in that IV antibiotics were financially untenable.  She was also concerned about her mother's emotional state and felt that allowing her to move to assisted living soon would be much preferable.  I also note that blood work here today showed that her inflammatory markers (sed rate and C-reactive protein) were both normal.  Based on all of these factors I suggested that we have the PICC line removed and changed her to Augmentin 500 mg twice daily.  She completed 6 weeks of therapy on 04/06/2019.  She had no problems tolerating Augmentin.  She is not having any problems opening her mouth.  She is not having any pain or swelling of her jaw.   Review of Systems: Review  of Systems  Constitutional: Negative for fever.  Gastrointestinal: Negative for abdominal pain, diarrhea, nausea and vomiting.    Past Medical History:  Diagnosis Date  . Abrasion of cornea, left   . Anal ulcer   . Anxiety   . Aphthous ulcer   . Carotid artery stenosis   . COPD (chronic obstructive pulmonary disease) ([place])   . Elevated hemidiaphragm    Left  . Fracture of femur, shaft ([place])    closed, Right  . GERD (gastroesophageal reflux disease)   . Hyperkalemia   . Incontinence of urine    urge  . Osteoarthritis   . Osteoporosis   . Peripheral neuropathy     Social History   Tobacco Use  . Smoking status: Never Smoker  . Smokeless tobacco: Never Used  Substance Use Topics  . Alcohol use: No  . Drug use: No    Family History  Problem Relation Age of Onset  . Alzheimer's disease Other   . Diabetes type II Other   . Hypertension Other   . Mental retardation Other   . Osteoporosis Other   . Parkinsonism Other   . Cancer Other   . Heart disease Other     Allergies  Allergen Reactions  . Tape Other (See Comments)    SKIN IS THIN AND WILL TEAR EASILY!!    Objective: Vitals:   04/23/19 1109  BP: 120/69  Pulse: 68  Temp: (!) 97.4 F (36.3 C)  TempSrc: Oral  SpO2: 100%  Weight: 102 lb 6.4 oz (46.4 kg)   Body mass index is 22.16 kg/m.  Physical Exam Constitutional:      Comments: She does not have any recall of meeting me previously.  She is pleasant and in good spirits.  She is seated in a wheelchair.  HENT:     Mouth/Throat:     Comments: She can open her mouth widely without any difficulty or pain.  No unusual swelling or redness along her right jaw.      Problem List Items Addressed This Visit      High   Osteomyelitis of mandible    I am hopeful that her infection has been cured.  I recommend continued observation off of antibiotics.  She can follow-up here as needed.          Michel Bickers, MD [facility] for Infectious  [facility] [phone] pager   [phone] cell 04/23/2019, 11:19 AM

## 2019-04-30 ENCOUNTER — Other Ambulatory Visit (HOSPITAL_COMMUNITY): Payer: Medicare Other

## 2019-05-08 ENCOUNTER — Other Ambulatory Visit: Payer: Self-pay

## 2019-05-08 ENCOUNTER — Ambulatory Visit (HOSPITAL_COMMUNITY): Payer: Medicare Other | Attending: Cardiology

## 2019-05-08 DIAGNOSIS — I10 Essential (primary) hypertension: Secondary | ICD-10-CM | POA: Diagnosis not present

## 2019-05-08 DIAGNOSIS — I499 Cardiac arrhythmia, unspecified: Secondary | ICD-10-CM | POA: Insufficient documentation

## 2019-08-20 ENCOUNTER — Emergency Department (HOSPITAL_COMMUNITY): Payer: Medicare Other

## 2019-08-20 ENCOUNTER — Other Ambulatory Visit: Payer: Self-pay

## 2019-08-20 ENCOUNTER — Emergency Department (HOSPITAL_COMMUNITY)
Admission: EM | Admit: 2019-08-20 | Discharge: 2019-08-20 | Disposition: A | Discharge status: Skilled Nursing Facility | Payer: Medicare Other | Attending: Emergency Medicine | Admitting: Emergency Medicine

## 2019-08-20 DIAGNOSIS — Y929 Unspecified place or not applicable: Secondary | ICD-10-CM | POA: Diagnosis not present

## 2019-08-20 DIAGNOSIS — Y999 Unspecified external cause status: Secondary | ICD-10-CM | POA: Diagnosis not present

## 2019-08-20 DIAGNOSIS — M779 Enthesopathy, unspecified: Secondary | ICD-10-CM | POA: Diagnosis not present

## 2019-08-20 DIAGNOSIS — I2510 Atherosclerotic heart disease of native coronary artery without angina pectoris: Secondary | ICD-10-CM | POA: Insufficient documentation

## 2019-08-20 DIAGNOSIS — F0390 Unspecified dementia without behavioral disturbance: Secondary | ICD-10-CM | POA: Diagnosis not present

## 2019-08-20 DIAGNOSIS — J449 Chronic obstructive pulmonary disease, unspecified: Secondary | ICD-10-CM | POA: Diagnosis not present

## 2019-08-20 DIAGNOSIS — W19XXXA Unspecified fall, initial encounter: Secondary | ICD-10-CM

## 2019-08-20 DIAGNOSIS — M79672 Pain in left foot: Secondary | ICD-10-CM | POA: Diagnosis not present

## 2019-08-20 DIAGNOSIS — Y939 Activity, unspecified: Secondary | ICD-10-CM | POA: Diagnosis not present

## 2019-08-20 DIAGNOSIS — Z79899 Other long term (current) drug therapy: Secondary | ICD-10-CM | POA: Insufficient documentation

## 2019-08-20 DIAGNOSIS — W050XXA Fall from non-moving wheelchair, initial encounter: Secondary | ICD-10-CM | POA: Diagnosis not present

## 2019-08-20 DIAGNOSIS — M25551 Pain in right hip: Secondary | ICD-10-CM | POA: Diagnosis present

## 2019-08-20 MED ORDER — ACETAMINOPHEN 325 MG PO TABS
650.0000 mg | ORAL_TABLET | Freq: Once | ORAL | Status: AC
  Administered 2019-08-20: 650 mg via ORAL
  Filled 2019-08-20: qty 2

## 2019-08-20 NOTE — ED Triage Notes (Signed)
Pt from [facility] via EMS [place] out of wheelchair - unwitnessed  Hit head - lac on R side of forehead and on hands C/o R hip pain, tenderness but no obvious deformity No blood thinners Hx: dementia - at baseline  Daughter on the way 145/80 88 HR 18 R 96% 4L 97.8 temp

## 2019-08-20 NOTE — ED Notes (Signed)
New dressing applied to L hand.

## 2019-08-20 NOTE — Discharge Instructions (Addendum)
The work up today was reassuring. No broken bones or brain bleeding. No hip or femur fracture.  Beverly Adkins was found to have bone spurs on her left heel, this could be contributing to her heel pain. Use Tylenol as needed for pain. Wash the cuts daily and keep bandaged until healed. Follow-up with your primary care doctor as needed for further evaluation. Return to the emergency room if any new, worsening, or concerning symptoms.

## 2019-08-20 NOTE — ED Provider Notes (Signed)
[facility] DEPT Provider Note   CSN: [id number] Arrival date & time: 08/20/19  1454     History No chief complaint on file.   Beverly Adkins is a 86 y.o. female presenting for evaluation after fall.  Level 5 caveat due to dementia.  Patient cannot remember if she fell or slid out of her wheelchair.  Patient states she is having pain of her right hip.  She denies pain elsewhere.   Additional history obtained from patient's daughter.  Patient has a history of frequent falls.  Is mostly wheelchair-bound at this time.  Has recently been complaining of left heel pain, had a plan to get x-rays today but that did not happen yet.  She is not on blood thinners.  Additional history obtained from triage note.  Patient had an unwitnessed fall out of wheelchair.  But her head and has a laceration of the forehead and hand.  Complaining of hip pain.  HPI     Past Medical History:  Diagnosis Date  . Abrasion of cornea, left   . Anal ulcer   . Anxiety   . Aphthous ulcer   . Carotid artery stenosis   . COPD (chronic obstructive pulmonary disease) ([place])   . Elevated hemidiaphragm    Left  . Fracture of femur, shaft ([place])    closed, Right  . GERD (gastroesophageal reflux disease)   . Hyperkalemia   . Incontinence of urine    urge  . Osteoarthritis   . Osteoporosis   . Peripheral neuropathy     Patient Active Problem List   Diagnosis Date Noted  . Recurrent falls 02/23/2019  . 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 COPD (chronic obstructive pulmonary disease) ([place]) 02/22/2019  . DJD (degenerative joint disease) 02/22/2019  . Near syncope 01/19/2019  . Dementia ([place]) 01/19/2019  . Constipation 10/18/2011  . Fracture lumbar vertebra-closed ([place]) 10/18/2011  . 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 Anxiety   . Aphthous ulcer   . Carotid artery stenosis   . GERD (gastroesophageal reflux disease)   . Osteoporosis   . Peripheral neuropathy   .  Incontinence of urine     Past Surgical History:  Procedure Laterality Date  . CATARACT EXTRACTION    . CESAREAN SECTION    . FOOT SURGERY    . HIP SURGERY     right, Dr. Ninfa Linden  . neuroplasty decompression median nerve at carpal tunnel       OB History   No obstetric history on file.     Family History  Problem Relation Age of Onset  . Alzheimer's disease Other   . Diabetes type II Other   . Hypertension Other   . Mental retardation Other   . Osteoporosis Other   . Parkinsonism Other   . Cancer Other   . Heart disease Other     Social History   Tobacco Use  . Smoking status: Never Smoker  . Smokeless tobacco: Never Used  Substance Use Topics  . Alcohol use: No  . Drug use: No    Home Medications Prior to Admission medications   Medication Sig Start Date End Date Taking? Authorizing Provider  acidophilus (RISAQUAD) CAPS capsule Take 1 capsule by mouth daily.    [provider]  carvedilol (COREG) 3.125 MG tablet Take 1 tablet (3.125 mg total) by mouth 2 (two) times daily with a meal. 01/24/19   Antonieta Pert, MD  chlorhexidine (PERIDEX) 0.12 % solution  04/04/19   [provider]  furosemide (LASIX) 40 MG tablet  04/17/19   [provider]  ipratropium-albuterol (DUONEB) 0.5-2.5 (3) MG/3ML SOLN Take 3 mLs by nebulization every 6 (six) hours as needed. 01/24/19   Antonieta Pert, MD  OXYGEN Inhale 3 L into the lungs.    [provider]  senna-docusate (SENOKOT-S) 8.6-50 MG tablet Take 1 tablet by mouth daily as needed for mild constipation. 01/24/19   Antonieta Pert, MD  sorbitol 70 % SOLN Take 20 mLs by mouth daily as needed (CONSTIPATION). 10/27/11   Mendel Corning, MD  SYMBICORT 160-4.5 MCG/ACT inhaler  04/04/19   [provider]  tamsulosin (FLOMAX) 0.4 MG CAPS capsule Take 1 capsule (0.4 mg total) by mouth daily after supper. 01/24/19   Antonieta Pert, MD  zinc gluconate 50 MG tablet Take 50 mg by mouth daily.    [provider]      Allergies    Tape  Review of Systems   Review of Systems  Unable to perform ROS: Dementia  Musculoskeletal: Positive for arthralgias.  Skin: Positive for wound.    Physical Exam Updated Vital Signs BP 131/63   Pulse 88   Temp 97.7 F (36.5 C) (Oral)   Resp 15   SpO2 100%   Physical Exam Vitals and nursing note reviewed.  Constitutional:      General: She is not in acute distress.    Appearance: She is well-developed.     Comments: Elderly female resting comfortably in the bed in no acute distress  HENT:     Head: Normocephalic.     Comments: Skin tear of the right anterior forehead without active bleeding.  No surrounding crepitus or underlying hematoma. Eyes:     Extraocular Movements: Extraocular movements intact.     Conjunctiva/sclera: Conjunctivae normal.     Pupils: Pupils are equal, round, and reactive to light.  Cardiovascular:     Rate and Rhythm: Normal rate and regular rhythm.     Pulses: Normal pulses.  Pulmonary:     Effort: Pulmonary effort is normal.     Breath sounds: Normal breath sounds.  Chest:     Chest wall: No tenderness.  Abdominal:     General: There is no distension.     Palpations: There is no mass.     Tenderness: There is no abdominal tenderness. There is no guarding or rebound.  Musculoskeletal:        General: Normal range of motion.     Cervical back: Normal range of motion.     Comments: Mild tenderness palpation of the right hip.  No obvious deformity.  No shortening or rotation.  Bilateral lower extremity 2+ pitting edema, baseline per daughter.  No wounds or obvious deformity of the left heel. Positive range of motion of upper extremities without pain.  Skin tear of the left dorsal hand without bleeding.  Skin:    General: Skin is warm.     Capillary Refill: Capillary refill takes less than 2 seconds.     Findings: No rash.  Neurological:     Mental Status: She is alert and oriented to person, place, and time.      Comments: Oriented to person, place, time     ED Results / Procedures / Treatments   Labs (all labs ordered are listed, but only abnormal results are displayed) Labs Reviewed - No data to display  EKG None  Radiology CT Head Wo Contrast  Result Date: 08/20/2019 CLINICAL DATA:  Head trauma  with right-sided forehead laceration after falling from a wheelchair. EXAM: CT HEAD WITHOUT CONTRAST CT CERVICAL SPINE WITHOUT CONTRAST TECHNIQUE: Multidetector CT imaging of the head and cervical spine was performed following the standard protocol without intravenous contrast. Multiplanar CT image reconstructions of the cervical spine were also generated. COMPARISON:  None. FINDINGS: CT HEAD FINDINGS Brain: No evidence of acute infarction, hemorrhage, hydrocephalus, extra-axial collection or mass lesion/mass effect. Diffuse mild atrophy. Slight periventricular white matter lucency primarily in the occipital regions consistent with chronic small vessel ischemic disease, stable. Vascular: No hyperdense vessel or unexpected calcification. Skull: Normal. Negative for fracture or focal lesion. Sinuses/Orbits: Normal Other: None CT CERVICAL SPINE FINDINGS Alignment: Chronic 2 mm anterolisthesis of C3 on C4. Skull base and vertebrae: No acute fractures. Old compression fracture of the T1 vertebral body. Soft tissues and spinal canal: No prevertebral fluid or swelling. No visible canal hematoma. Disc levels: C2-3: Broad-based small disc protrusion. Moderate bilateral facet arthritis. Slight left foraminal stenosis. C3-4: Disc space narrowing. 2 mm anterolisthesis. Small uncinate spurs extend into both neural foramina with severe bilateral foraminal stenosis. Severe left facet arthritis. C4-5: Chronic disc space narrowing. Small broad-based disc osteophyte complex. Moderate left and slight right foraminal narrowing. Moderate left facet arthritis. C5-6: Chronic disc space narrowing. Tiny broad-based disc osteophyte complex  without neural impingement. No foraminal stenosis. C6-7: Small broad-based disc bulge without neural impingement. Otherwise negative. C7-T1: There old mild compression deformity of the anterior superior aspect of T1. No disc bulging or protrusion. No foraminal stenosis. No facet arthritis. T1-2: Old mild compression fracture of the inferior endplate of T1. Upper chest: Negative. Other: None IMPRESSION: No acute intracranial abnormality. Chronic atrophy. Minimal chronic small vessel ischemic disease. No acute abnormality of the cervical spine. Old compression fractures of T1. Electronically Signed   By: Lorriane Shire M.D.   On: 08/20/2019 16:09   CT Cervical Spine Wo Contrast  Result Date: 08/20/2019 CLINICAL DATA:  Head trauma with right-sided forehead laceration after falling from a wheelchair. EXAM: CT HEAD WITHOUT CONTRAST CT CERVICAL SPINE WITHOUT CONTRAST TECHNIQUE: Multidetector CT imaging of the head and cervical spine was performed following the standard protocol without intravenous contrast. Multiplanar CT image reconstructions of the cervical spine were also generated. COMPARISON:  None. FINDINGS: CT HEAD FINDINGS Brain: No evidence of acute infarction, hemorrhage, hydrocephalus, extra-axial collection or mass lesion/mass effect. Diffuse mild atrophy. Slight periventricular white matter lucency primarily in the occipital regions consistent with chronic small vessel ischemic disease, stable. Vascular: No hyperdense vessel or unexpected calcification. Skull: Normal. Negative for fracture or focal lesion. Sinuses/Orbits: Normal Other: None CT CERVICAL SPINE FINDINGS Alignment: Chronic 2 mm anterolisthesis of C3 on C4. Skull base and vertebrae: No acute fractures. Old compression fracture of the T1 vertebral body. Soft tissues and spinal canal: No prevertebral fluid or swelling. No visible canal hematoma. Disc levels: C2-3: Broad-based small disc protrusion. Moderate bilateral facet arthritis. Slight left  foraminal stenosis. C3-4: Disc space narrowing. 2 mm anterolisthesis. Small uncinate spurs extend into both neural foramina with severe bilateral foraminal stenosis. Severe left facet arthritis. C4-5: Chronic disc space narrowing. Small broad-based disc osteophyte complex. Moderate left and slight right foraminal narrowing. Moderate left facet arthritis. C5-6: Chronic disc space narrowing. Tiny broad-based disc osteophyte complex without neural impingement. No foraminal stenosis. C6-7: Small broad-based disc bulge without neural impingement. Otherwise negative. C7-T1: There old mild compression deformity of the anterior superior aspect of T1. No disc bulging or protrusion. No foraminal stenosis. No facet arthritis. T1-2: Old mild  compression fracture of the inferior endplate of T1. Upper chest: Negative. Other: None IMPRESSION: No acute intracranial abnormality. Chronic atrophy. Minimal chronic small vessel ischemic disease. No acute abnormality of the cervical spine. Old compression fractures of T1. Electronically Signed   By: Lorriane Shire M.D.   On: 08/20/2019 16:09   DG Foot Complete Left  Result Date: 08/20/2019 CLINICAL DATA:  Left heel pain after falling out of a wheelchair. EXAM: LEFT FOOT - COMPLETE 3+ VIEW COMPARISON:  None. FINDINGS: Mild-to-moderate 1st MTP joint space narrowing and associated spur formation. Diffuse osteopenia. Mild to moderate posterior calcaneal enthesophyte formation. Mild diffuse soft tissue swelling. No fracture or dislocation seen. IMPRESSION: 1. No fracture. 2. Mild diffuse soft tissue swelling. 3. First MTP joint degenerative changes. Electronically Signed   By: Claudie Revering M.D.   On: 08/20/2019 16:19   DG Hips Bilat W or Wo Pelvis 3-4 Views  Result Date: 08/20/2019 CLINICAL DATA:  Right hip pain and tenderness following a fall. EXAM: DG HIP (WITH OR WITHOUT PELVIS) 3-4V BILAT COMPARISON:  Portable pelvis dated 01/20/2019. FINDINGS: Normal appearing left hip without  fracture or dislocation. Stable hardware fixation of the proximal right femur without acute fracture or dislocation. Diffuse osteopenia. Lower lumbar spine degenerative changes. IMPRESSION: No acute fracture or dislocation. Electronically Signed   By: Claudie Revering M.D.   On: 08/20/2019 16:17    Procedures Procedures (including critical care time)  Medications Ordered in ED Medications  acetaminophen (TYLENOL) tablet 650 mg (650 mg Oral Given 08/20/19 1718)    ED Course  I have reviewed the triage vital signs and the nursing notes.  Pertinent labs & imaging results that were available during my care of the patient were reviewed by me and considered in my medical decision making (see chart for details).    MDM Rules/Calculators/A&P                      Patient presenting for evaluation after an unwitnessed fall.  Physical exam reassuring, she appears nontoxic.  She has a skin tear of her forehead and left hand, no other obvious signs of injury.  She is reporting right hip pain.  Will obtain imaging of her head and neck and pelvis for further evaluation.  Per daughter, patient has been having left heel pain.  No obvious injury, deformity, or wound.  However recently patient had osteomyelitis of the jaw, as such we will obtain x-ray to rule out osteo or other deformity. Case dicussed with attending, Dr. Roderic Palau evaluated the pt.   CT head and neck negative for acute findings.  Pelvis x-ray viewed interpreted by me, no fracture dislocation.  Foot x-ray without fracture dislocation, does show bone spurs.  I discussed findings with patient's daughter.  Discussed treatment with Tylenol and follow-up with PCP as needed.  At this time, patient appears safe for discharge.  Return precautions given.  Patient and daughter state they understand and agree to plan.  Final Clinical Impression(s) / ED Diagnoses Final diagnoses:  Fall, initial encounter  Bone spur    Rx / DC Orders ED Discharge Orders     None       Franchot Heidelberg, PA-C 08/20/19 1724    Milton Ferguson, MD 08/20/19 2253

## 2019-09-26 ENCOUNTER — Ambulatory Visit: Payer: Medicare Other | Admitting: Internal Medicine

## 2019-11-28 ENCOUNTER — Encounter (HOSPITAL_COMMUNITY): Payer: Self-pay | Admitting: Emergency Medicine

## 2019-11-28 ENCOUNTER — Other Ambulatory Visit: Payer: Self-pay

## 2019-11-28 ENCOUNTER — Emergency Department (HOSPITAL_COMMUNITY)
Admission: EM | Admit: 2019-11-28 | Discharge: 2019-11-28 | Disposition: A | Discharge status: Home / Self Care | Payer: Medicare Other | Attending: Emergency Medicine | Admitting: Emergency Medicine

## 2019-11-28 ENCOUNTER — Emergency Department (HOSPITAL_COMMUNITY): Payer: Medicare Other

## 2019-11-28 DIAGNOSIS — Z5321 Procedure and treatment not carried out due to patient leaving prior to being seen by health care provider: Secondary | ICD-10-CM | POA: Diagnosis not present

## 2019-11-28 DIAGNOSIS — M7989 Other specified soft tissue disorders: Secondary | ICD-10-CM | POA: Insufficient documentation

## 2019-11-28 DIAGNOSIS — R0602 Shortness of breath: Secondary | ICD-10-CM | POA: Insufficient documentation

## 2019-11-28 LAB — BASIC METABOLIC PANEL
Anion gap: 13 (ref 5–15)
BUN: 24 mg/dL — ABNORMAL HIGH (ref 8–23)
CO2: 37 mmol/L — ABNORMAL HIGH (ref 22–32)
Calcium: 8.8 mg/dL — ABNORMAL LOW (ref 8.9–10.3)
Chloride: 87 mmol/L — ABNORMAL LOW (ref 98–111)
Creatinine, Ser: 0.66 mg/dL (ref 0.44–1.00)
GFR calc Af Amer: >60 mL/min (ref >60)
GFR calc non Af Amer: >60 mL/min (ref >60)
Glucose, Bld: 145 mg/dL — ABNORMAL HIGH (ref 70–99)
Potassium: 3.9 mmol/L (ref 3.5–5.1)
Sodium: 137 mmol/L (ref 135–145)

## 2019-11-28 LAB — CBC
HCT: 40.1 % (ref 36.0–46.0)
Hemoglobin: 11.8 g/dL — ABNORMAL LOW (ref 12.0–15.0)
MCH: 27.6 pg (ref 26.0–34.0)
MCHC: 29.4 g/dL — ABNORMAL LOW (ref 30.0–36.0)
MCV: 93.9 fL (ref 80.0–100.0)
Platelets: 165 10*3/uL (ref 150–400)
RBC: 4.27 MIL/uL (ref 3.87–5.11)
RDW: 14.5 % (ref 11.5–15.5)
WBC: 5.1 10*3/uL (ref 4.0–10.5)
nRBC: 0 % (ref 0.0–0.2)

## 2019-11-28 LAB — TROPONIN I (HIGH SENSITIVITY): Troponin I (High Sensitivity): 12 ng/L (ref <18)

## 2019-11-28 MED ORDER — SODIUM CHLORIDE 0.9% FLUSH: 3.0000 mL | Freq: Once | INTRAVENOUS | Status: DC

## 2019-11-28 NOTE — ED Triage Notes (Signed)
Pt arrives to ED from  [facility] with complaints of worsening shortness of breath and increased leg swelling. Per staff patient has chronic SOB but has been worse today. Patient states she has gained four pounds in just one day. Patient currently on 3L  at all times for COPD.

## 2020-01-22 ENCOUNTER — Ambulatory Visit (INDEPENDENT_AMBULATORY_CARE_PROVIDER_SITE_OTHER): Payer: Medicare Other | Admitting: Infectious Disease

## 2020-01-22 ENCOUNTER — Encounter: Payer: Self-pay | Admitting: Infectious Disease

## 2020-01-22 ENCOUNTER — Other Ambulatory Visit: Payer: Self-pay

## 2020-01-22 VITALS — BP 100/60 | HR 71 | Temp 97.6°F

## 2020-01-22 DIAGNOSIS — M272 Inflammatory conditions of jaws: Secondary | ICD-10-CM | POA: Diagnosis present

## 2020-01-22 DIAGNOSIS — F0390 Unspecified dementia without behavioral disturbance: Secondary | ICD-10-CM | POA: Diagnosis not present

## 2020-01-22 DIAGNOSIS — J439 Emphysema, unspecified: Secondary | ICD-10-CM | POA: Diagnosis not present

## 2020-01-22 MED ORDER — AMOXICILLIN-POT CLAVULANATE 875-125 MG PO TABS: 1.0000 | ORAL_TABLET | Freq: Two times a day (BID) | ORAL | 1 refills | Status: DC

## 2020-01-22 NOTE — Progress Notes (Signed)
Subjective:  Reason for infectious disease consult: Concern for recurrent osteomyelitis of mandible  Requesting physician: Lucianne Muss NP   Patient ID: Beverly Adkins, female    DOB: [birth year], 86 y.o.   MRN: [id number]  HPI  Beverly Adkins is a 86 year old Caucasian female who suffers from dementia and osteoporosis who was diagnosed with osteomyelitis of her right mandible.  She was seen by my partner Dr. Megan Salon and offered intravenous antibiotics.  This was not pursued however because this would have required her to be shifted to the skilled nursing facility part of her nursing home.  This would have been apparently prohibitively expensive for family to cover.  Therefore Dr. Megan Salon gave her Augmentin for 6 weeks and she seemed to have been improved and resolved in terms of her infection in September 2020.  In the interim she is experienced swelling of her right jaw yet again and plain films were obtained these plain films are read as showing a mandibular fracture.  The daughter does not believe that this is an actual fracture and says that similar imaging last year had reportedly shown a fracture but in reality the patient has exposed bone due to osteonecrosis.  On exam she has some poor dentition on the right side with at least 1 tooth that does not appear as healthy as I would expect.  She is also tender along her jawline and I am concerned that her osteomyelitis may have recurred and/or that she may have an actual fracture  Past Medical History:  Diagnosis Date  . Abrasion of cornea, left   . Anal ulcer   . Anxiety   . Aphthous ulcer   . Carotid artery stenosis   . COPD (chronic obstructive pulmonary disease) ([place])   . Elevated hemidiaphragm    Left  . Fracture of femur, shaft ([place])    closed, Right  . GERD (gastroesophageal reflux disease)   . Hyperkalemia   . Incontinence of urine    urge  . Osteoarthritis   . Osteoporosis   . Peripheral neuropathy     Past Surgical  History:  Procedure Laterality Date  . CATARACT EXTRACTION    . CESAREAN SECTION    . FOOT SURGERY    . HIP SURGERY     right, Dr. Ninfa Linden  . neuroplasty decompression median nerve at carpal tunnel      Family History  Problem Relation Age of Onset  . Alzheimer's disease Other   . Diabetes type II Other   . Hypertension Other   . Mental retardation Other   . Osteoporosis Other   . Parkinsonism Other   . Cancer Other   . Heart disease Other       Social History   Socioeconomic History  . Marital status: Widowed    Spouse name: Not on file  . Number of children: 2  . Years of education: Not on file  . Highest education level: Not on file  Occupational History  . Occupation: RETIRED    Employer: RETIRED  Tobacco Use  . Smoking status: Never Smoker  . Smokeless tobacco: Never Used  Substance and Sexual Activity  . Alcohol use: No  . Drug use: No  . Sexual activity: Not on file  Other Topics Concern  . Not on file  Social History Narrative  . Not on file   Social Determinants of Health   Financial Resource Strain:   . Difficulty of Paying Living Expenses:   Food Insecurity:   . Worried About  Running Out of Food in the Last Year:   . [place] in the Last Year:   Transportation Needs:   . Lack of Transportation (Medical):   Marland Kitchen Lack of Transportation (Non-Medical):   Physical Activity:   . Days of Exercise per Week:   . Minutes of Exercise per Session:   Stress:   . Feeling of Stress :   Social Connections:   . Frequency of Communication with Friends and Family:   . Frequency of Social Gatherings with Friends and Family:   . Attends Religious Services:   . Active Member of Clubs or Organizations:   . Attends Archivist Meetings:   Marland Kitchen Marital Status:     Allergies  Allergen Reactions  . Tape Other (See Comments)    SKIN IS THIN AND WILL TEAR EASILY!!     Current Outpatient Medications:  .  carvedilol (COREG) 3.125 MG tablet, Take 1  tablet (3.125 mg total) by mouth 2 (two) times daily with a meal., Disp:  , Rfl:  .  chlorhexidine (PERIDEX) 0.12 % solution, , Disp: , Rfl:  .  furosemide (LASIX) 40 MG tablet, , Disp: , Rfl:  .  ipratropium-albuterol (DUONEB) 0.5-2.5 (3) MG/3ML SOLN, Take 3 mLs by nebulization every 6 (six) hours as needed., Disp: 360 mL, Rfl:  .  olopatadine (PATANOL) 0.1 % ophthalmic solution, 1 drop daily., Disp: , Rfl:  .  omeprazole (PRILOSEC) 20 MG capsule, Take 20 mg by mouth daily., Disp: , Rfl:  .  OXYGEN, Inhale 3 L into the lungs., Disp: , Rfl:  .  senna-docusate (SENOKOT-S) 8.6-50 MG tablet, Take 1 tablet by mouth daily as needed for mild constipation., Disp:  , Rfl:  .  Skin Protectants, Misc. (CALAZIME SKIN PROTECTANT EX), Apply topically in the morning, at noon, and at bedtime., Disp: , Rfl:  .  SYMBICORT 160-4.5 MCG/ACT inhaler, , Disp: , Rfl:  .  tamsulosin (FLOMAX) 0.4 MG CAPS capsule, Take 1 capsule (0.4 mg total) by mouth daily after supper., Disp: 30 capsule, Rfl:  .  Zinc Oxide 25 % PSTE, Apply topically., Disp: , Rfl:  .  acidophilus (RISAQUAD) CAPS capsule, Take 1 capsule by mouth daily. (Patient not taking: Reported on 01/22/2020), Disp: , Rfl:  .  amoxicillin-clavulanate (AUGMENTIN) 875-125 MG tablet, Take 1 tablet by mouth 2 (two) times daily., Disp: 60 tablet, Rfl: 1 .  sorbitol 70 % SOLN, Take 20 mLs by mouth daily as needed (CONSTIPATION). (Patient not taking: Reported on 01/22/2020), Disp: 500 mL, Rfl: 1 .  zinc gluconate 50 MG tablet, Take 50 mg by mouth daily. (Patient not taking: Reported on 01/22/2020), Disp: , Rfl:     Review of Systems  Unable to perform ROS: Dementia       Objective:   Physical Exam Vitals reviewed.  Constitutional:      General: She is not in acute distress.    Appearance: Normal appearance. She is well-developed. She is not ill-appearing or diaphoretic.  HENT:     Head: Normocephalic and atraumatic.     Right Ear: Hearing and external ear normal.      Left Ear: Hearing and external ear normal.     Nose: No nasal deformity or rhinorrhea.     Mouth/Throat:     Dentition: Abnormal dentition.   Eyes:     General: No scleral icterus.    Extraocular Movements: Extraocular movements intact.     Conjunctiva/sclera: Conjunctivae normal.     Right eye:  Right conjunctiva is not injected.     Left eye: Left conjunctiva is not injected.  Neck:     Vascular: No JVD.   Cardiovascular:     Rate and Rhythm: Normal rate and regular rhythm.     Heart sounds: S1 normal and S2 normal.  Pulmonary:     Effort: Pulmonary effort is normal. No respiratory distress.     Breath sounds: No wheezing.  Abdominal:     General: Bowel sounds are normal. There is no distension.     Palpations: Abdomen is soft.     Tenderness: There is no abdominal tenderness.  Musculoskeletal:        General: Normal range of motion.     Right shoulder: Normal.     Left shoulder: Normal.     Cervical back: Normal range of motion and neck supple.     Right hip: Normal.     Left hip: Normal.     Right knee: Normal.     Left knee: Normal.  Lymphadenopathy:     Head:     Right side of head: No submandibular, preauricular or posterior auricular adenopathy.     Left side of head: No submandibular, preauricular or posterior auricular adenopathy.     Cervical: No cervical adenopathy.     Right cervical: No superficial or deep cervical adenopathy.    Left cervical: No superficial or deep cervical adenopathy.  Skin:    General: Skin is warm and dry.     Coloration: Skin is not pale.     Findings: No abrasion, bruising, ecchymosis, erythema, lesion or rash.     Nails: There is no clubbing.  Neurological:     Mental Status: She is alert. She is disoriented.     Sensory: No sensory deficit.     Coordination: Coordination normal.     Gait: Gait normal.  Psychiatric:        Attention and Perception: She is attentive.        Speech: Speech normal.        Behavior: Behavior  normal. Behavior is cooperative.        Cognition and Memory: Cognition is impaired. Memory is impaired. She exhibits impaired recent memory and impaired remote memory.        Judgment: Judgment normal.           Assessment & Plan:   Concern for recurrent osteomyelitis of the madible: check laboratory markers BMP CBC and check CT maxillofacial we will start Augmentin and give her 6-week course.  She very likely need to see an oral maxillofacial surgeon again although the daughter is reluctant for that to happen until we have had further data collected.  Hospital mandibular fracture: Daughter debates this will have greater clarity with a CT maxillofacial

## 2020-01-23 LAB — BASIC METABOLIC PANEL WITH GFR
BUN: 23 mg/dL (ref 7–25)
CO2: 44 mmol/L — ABNORMAL HIGH (ref 20–32)
Calcium: 8.8 mg/dL (ref 8.6–10.4)
Chloride: 93 mmol/L — ABNORMAL LOW (ref 98–110)
Creat: 0.63 mg/dL (ref 0.60–0.88)
GFR, Est African American: 90 mL/min/{1.73_m2} (ref >=60)
GFR, Est Non African American: 77 mL/min/{1.73_m2} (ref >=60)
Glucose, Bld: 88 mg/dL (ref 65–99)
Potassium: 4 mmol/L (ref 3.5–5.3)
Sodium: 140 mmol/L (ref 135–146)

## 2020-01-23 LAB — CBC WITH DIFFERENTIAL/PLATELET
Absolute Monocytes: 408 cells/uL (ref 200–950)
Basophils Absolute: 11 cells/uL (ref 0–200)
Basophils Relative: 0.2 %
Eosinophils Absolute: 281 cells/uL (ref 15–500)
Eosinophils Relative: 5.3 %
HCT: 32.3 % — ABNORMAL LOW (ref 35.0–45.0)
Hemoglobin: 10.2 g/dL — ABNORMAL LOW (ref 11.7–15.5)
Lymphs Abs: 875 cells/uL (ref 850–3900)
MCH: 27.8 pg (ref 27.0–33.0)
MCHC: 31.6 g/dL — ABNORMAL LOW (ref 32.0–36.0)
MCV: 88 fL (ref 80.0–100.0)
MPV: 11.6 fL (ref 7.5–12.5)
Monocytes Relative: 7.7 %
Neutro Abs: 3726 cells/uL (ref 1500–7800)
Neutrophils Relative %: 70.3 %
Platelets: 174 10*3/uL (ref 140–400)
RBC: 3.67 10*6/uL — ABNORMAL LOW (ref 3.80–5.10)
RDW: 13.4 % (ref 11.0–15.0)
Total Lymphocyte: 16.5 %
WBC: 5.3 10*3/uL (ref 3.8–10.8)

## 2020-01-23 LAB — C-REACTIVE PROTEIN: CRP: 16 mg/L — ABNORMAL HIGH (ref <8.0)

## 2020-01-23 LAB — SEDIMENTATION RATE: Sed Rate: 38 mm/h — ABNORMAL HIGH (ref 0–30)

## 2020-01-29 ENCOUNTER — Other Ambulatory Visit: Payer: Self-pay

## 2020-01-29 ENCOUNTER — Ambulatory Visit (HOSPITAL_COMMUNITY)
Admission: RE | Admit: 2020-01-29 | Discharge: 2020-01-29 | Disposition: A | Discharge status: Home / Self Care | Payer: Medicare Other | Source: Ambulatory Visit | Attending: Infectious Disease | Admitting: Infectious Disease

## 2020-01-29 ENCOUNTER — Telehealth: Payer: Self-pay

## 2020-01-29 DIAGNOSIS — M272 Inflammatory conditions of jaws: Secondary | ICD-10-CM

## 2020-01-29 MED ORDER — IOHEXOL 300 MG/ML  SOLN
75.0000 mL | Freq: Once | INTRAMUSCULAR | Status: AC | PRN
  Administered 2020-01-29: 75 mL via INTRAVENOUS

## 2020-01-29 NOTE — Telephone Encounter (Signed)
Received call today from Imaging support at [facility] for order change on CT. States that order needs to be with or without contrast not both. Will need provider to correct order for patient. [place]

## 2020-02-07 NOTE — Progress Notes (Signed)
Office notes and CT results faxed to [facility] and updated daughter  about results. She is relieved there is no infection.  Family was not truly interested in being seen by oral surgeon.  Beverly Adkins

## 2020-03-03 ENCOUNTER — Other Ambulatory Visit: Payer: Self-pay

## 2020-03-03 ENCOUNTER — Encounter: Payer: Self-pay | Admitting: Internal Medicine

## 2020-03-03 ENCOUNTER — Ambulatory Visit (INDEPENDENT_AMBULATORY_CARE_PROVIDER_SITE_OTHER): Payer: Medicare Other | Admitting: Internal Medicine

## 2020-03-03 DIAGNOSIS — M272 Inflammatory conditions of jaws: Secondary | ICD-10-CM | POA: Diagnosis present

## 2020-03-03 MED ORDER — AMOXICILLIN-POT CLAVULANATE 875-125 MG PO TABS: 1.0000 | ORAL_TABLET | Freq: Two times a day (BID) | ORAL | 0 refills | Status: AC

## 2020-03-03 NOTE — Assessment & Plan Note (Signed)
She may have had some soft tissue infection but I do not see any evidence of recurrent osteomyelitis.  She will complete amoxicillin clavulanate tomorrow.  She can follow-up here as needed.

## 2020-03-03 NOTE — Progress Notes (Signed)
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    CARVEDILOL (COREG) 3.125 MG TABLET    Take 1 tablet (3.125 mg total) by mouth 2 (two) times daily with a meal.   CHLORHEXIDINE (PERIDEX) 0.12 % SOLUTION    2 (two) times daily.    FUROSEMIDE (LASIX) 40 MG TABLET    40 mg 2 (two) times daily.    INFANT CARE PRODUCTS (BABY [place] EX)    Apply topically. Once daily to wash  eyelids   IPRATROPIUM-ALBUTEROL (DUONEB) 0.5-2.5 (3) MG/3ML SOLN    Take 3 mLs by nebulization every 6 (six) hours as needed.   LORATADINE (CLARITIN) 10 MG TABLET    Take 10 mg by mouth daily.   OLOPATADINE (PATANOL) 0.1 % OPHTHALMIC SOLUTION    1 drop daily.   OMEPRAZOLE (PRILOSEC) 20 MG CAPSULE    Take 20 mg by mouth daily.   OXYGEN    Inhale 3 L into the lungs.   SENNA-DOCUSATE (SENOKOT-S) 8.6-50 MG TABLET    Take 1 tablet by mouth daily as needed for mild constipation.   SKIN PROTECTANTS, MISC. (CALAZIME SKIN PROTECTANT EX)    Apply topically in the morning, at noon, and at bedtime.   SORBITOL 70 % SOLN    Take 20 mLs by mouth daily as needed (CONSTIPATION).   SYMBICORT 160-4.5 MCG/ACT INHALER       TAMSULOSIN (FLOMAX) 0.4 MG CAPS CAPSULE    Take 1  capsule (0.4 mg total) by mouth daily after supper.   ZINC GLUCONATE 50 MG TABLET    Take 50 mg by mouth daily.   ZINC OXIDE 25 % PSTE    Apply topically.  Modified Medications   Modified Medication Previous Medication   AMOXICILLIN-CLAVULANATE (AUGMENTIN) 875-125 MG TABLET amoxicillin-clavulanate (AUGMENTIN) 875-125 MG tablet      Take 1 tablet by mouth 2 (two) times daily for 1 day.    Take 1 tablet by mouth 2 (two) times daily.  Discontinued Medications   No medications on file    Subjective: Ms. Buzan is in with her daughter for her routine follow-up visit.  I treated her for right mandibular osteomyelitis last fall.  She appeared to been cured following 6 weeks of amoxicillin clavulanate.  She recently developed swelling along her right jaw and cheek.  She did not have any pain or fever.  She was told that plain x-rays showed a mandibular fracture.  She was referred and seen by my partner, Dr. Tommy Medal on 01/22/2020.  He started her back on amoxicillin clavulanate and ordered a CT scan which showed:  IMPRESSION: Findings in keeping with history of treated right mandibular osteomyelitis. 2 sclerotic  areas could reflect retained tooth roots or involucra. No adjacent soft tissue inflammation, sterility is indeterminate.  Her daughter says that the swelling resolved completely after just a few days of antibiotic therapy.  Review of Systems: Review of Systems  Constitutional: Negative for fever.  Gastrointestinal: Negative for abdominal pain, diarrhea, nausea and vomiting.    Past Medical History:  Diagnosis Date   Abrasion of cornea, left    Anal ulcer    Anxiety    Aphthous ulcer    Carotid artery stenosis    COPD (chronic obstructive pulmonary disease) ([facility])    Elevated hemidiaphragm    Left   Fracture of femur, shaft ([facility])    closed, Right   GERD (gastroesophageal reflux disease)    Hyperkalemia    Incontinence of urine    urge   Osteoarthritis    Osteoporosis     Peripheral neuropathy     Social History   Tobacco Use   Smoking status: Never Smoker   Smokeless tobacco: Never Used  Substance Use Topics   Alcohol use: No   Drug use: No    Family History  Problem Relation Age of Onset   Alzheimer's disease Other    Diabetes type II Other    Hypertension Other    Mental retardation Other    Osteoporosis Other    Parkinsonism Other    Cancer Other    Heart disease Other     Allergies  Allergen Reactions   Tape Other (See Comments)    SKIN IS THIN AND WILL TEAR EASILY!!    Objective: Vitals:   03/03/20 1111  Temp: 97.8 F (36.6 C)  TempSrc: Oral  Weight: 112 lb (50.8 kg)  Height: 4\' 9"  (1.448 m)   Body mass index is 24.24 kg/m.  Physical Exam Constitutional:      Comments: She is seated in a wheelchair.  She is pleasant and in no distress.  HENT:     Mouth/Throat:     Comments: She has no unusual pain, swelling or redness along the right jawline or cheek.  I can see no abnormalities of her gumline.  She is missing some of her right mandibular molars.      Problem List Items Addressed This Visit      High   Osteomyelitis of mandible    She may have had some soft tissue infection but I do not see any evidence of recurrent osteomyelitis.  She will complete amoxicillin clavulanate tomorrow.  She can follow-up here as needed.      Relevant Medications   amoxicillin-clavulanate (AUGMENTIN) 875-125 MG tablet       Michel Bickers, MD [facility] for Infectious [place] [phone] pager   [phone] cell 03/03/2020, 11:33 AM

## 2020-04-01 ENCOUNTER — Ambulatory Visit (INDEPENDENT_AMBULATORY_CARE_PROVIDER_SITE_OTHER): Payer: Medicare Other | Admitting: Internal Medicine

## 2020-04-01 ENCOUNTER — Other Ambulatory Visit: Payer: Self-pay

## 2020-04-01 ENCOUNTER — Encounter: Payer: Self-pay | Admitting: Internal Medicine

## 2020-04-01 DIAGNOSIS — M272 Inflammatory conditions of jaws: Secondary | ICD-10-CM

## 2020-04-01 NOTE — Progress Notes (Signed)
[facility] for Infectious Disease  Patient Active Problem List   Diagnosis Date Noted  . Osteomyelitis of mandible 02/22/2019    Priority: High  . Recurrent falls 02/23/2019  . COPD (chronic obstructive pulmonary disease) ([place]) 02/22/2019  . DJD (degenerative joint disease) 02/22/2019  . Near syncope 01/19/2019  . Dementia ([place]) 01/19/2019  . Constipation 10/18/2011  . Fracture lumbar vertebra-closed ([place]) 10/18/2011  . 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 Anxiety   . Aphthous ulcer   . Carotid artery stenosis   . GERD (gastroesophageal reflux disease)   . Osteoporosis   . Peripheral neuropathy   . Incontinence of urine     Patient's Medications  New Prescriptions   No medications on file  Previous Medications   ACIDOPHILUS (RISAQUAD) CAPS CAPSULE    Take 1 capsule by mouth daily.    CARVEDILOL (COREG) 3.125 MG TABLET    Take 1 tablet (3.125 mg total) by mouth 2 (two) times daily with a meal.   CHLORHEXIDINE (PERIDEX) 0.12 % SOLUTION    2 (two) times daily.    FUROSEMIDE (LASIX) 40 MG TABLET    40 mg 2 (two) times daily.    INFANT CARE PRODUCTS (BABY [place] EX)    Apply topically. Once daily to wash  eyelids   IPRATROPIUM-ALBUTEROL (DUONEB) 0.5-2.5 (3) MG/3ML SOLN    Take 3 mLs by nebulization every 6 (six) hours as needed.   LORATADINE (CLARITIN) 10 MG TABLET    Take 10 mg by mouth daily.   OLOPATADINE (PATANOL) 0.1 % OPHTHALMIC SOLUTION    1 drop daily.   OMEPRAZOLE (PRILOSEC) 20 MG CAPSULE    Take 20 mg by mouth daily.   OXYGEN    Inhale 3 L into the lungs.   SENNA-DOCUSATE (SENOKOT-S) 8.6-50 MG TABLET    Take 1 tablet by mouth daily as needed for mild constipation.   SKIN PROTECTANTS, MISC. (CALAZIME SKIN PROTECTANT EX)    Apply topically in the morning, at noon, and at bedtime.   SORBITOL 70 % SOLN    Take 20 mLs by mouth daily as needed (CONSTIPATION).   SYMBICORT 160-4.5 MCG/ACT INHALER       TAMSULOSIN (FLOMAX) 0.4 MG CAPS CAPSULE    Take 1  capsule (0.4 mg total) by mouth daily after supper.   ZINC GLUCONATE 50 MG TABLET    Take 50 mg by mouth daily.   ZINC OXIDE 25 % PSTE    Apply topically.  Modified Medications   No medications on file  Discontinued Medications   No medications on file    Subjective: Beverly Adkins is in with her daughter for her routine follow-up visit.  I treated her for right mandibular osteomyelitis last fall.  She appeared to been cured following 6 weeks of amoxicillin clavulanate.  She recently developed swelling along her right jaw and cheek.  She did not have any pain or fever.  She was told that plain x-rays showed a mandibular fracture.  She was referred and seen by my partner, Dr. Tommy Medal on 01/22/2020.  He started her back on amoxicillin clavulanate and ordered a CT scan which showed:  IMPRESSION: Findings in keeping with history of treated right mandibular osteomyelitis. 2 sclerotic areas could reflect retained tooth roots or involucra. No adjacent soft tissue inflammation, sterility is indeterminate.  Her daughter says that the swelling resolved completely after just a few days of antibiotic therapy.  She completed antibiotic therapy on 03/03/2020.  She has had no recurrence of swelling or discomfort.  Review of Systems: Review of Systems  Constitutional: Negative for fever.  Gastrointestinal: Negative for abdominal pain, diarrhea, nausea and vomiting.    Past Medical History:  Diagnosis Date  . Abrasion of cornea, left   . Anal ulcer   . Anxiety   . Aphthous ulcer   . Carotid artery stenosis   . COPD (chronic obstructive pulmonary disease) ([place])   . Elevated hemidiaphragm    Left  . Fracture of femur, shaft ([place])    closed, Right  . GERD (gastroesophageal reflux disease)   . Hyperkalemia   . Incontinence of urine    urge  . Osteoarthritis   . Osteoporosis   . Peripheral neuropathy     Social History   Tobacco Use  . Smoking status: Never Smoker  . Smokeless tobacco: Never Used    Substance Use Topics  . Alcohol use: No  . Drug use: No    Family History  Problem Relation Age of Onset  . Alzheimer's disease Other   . Diabetes type II Other   . Hypertension Other   . Mental retardation Other   . Osteoporosis Other   . Parkinsonism Other   . Cancer Other   . Heart disease Other     Allergies  Allergen Reactions  . Tape Other (See Comments)    SKIN IS THIN AND WILL TEAR EASILY!!    Objective: Vitals:   04/01/20 1421  BP: 109/70  Pulse: 81  Temp: 97.8 F (36.6 C)  Height: 4\' 9"  (1.448 m)   Body mass index is 24.24 kg/m.  Physical Exam Constitutional:      Comments: She is seated in a wheelchair.  She wants to know if tomatoes caused the problem.  HENT:     Mouth/Throat:     Comments: She has no unusual pain, swelling or redness along the right jawline or cheek.  I can see no abnormalities of her gumline.        Problem List Items Addressed This Visit      High   Osteomyelitis of mandible    I am not sure what caused the recent swelling along her right jawline but it resolved promptly with antibiotic therapy and has not recurred.  I told her that she does not need any further tests or treatment for this.  She can follow-up here as needed.          Michel Bickers, MD [facility] for Infectious [place] [facility] [phone] pager   [phone] cell 04/01/2020, 2:36 PM

## 2020-04-01 NOTE — Assessment & Plan Note (Signed)
I am not sure what caused the recent swelling along her right jawline but it resolved promptly with antibiotic therapy and has not recurred.  I told her that she does not need any further tests or treatment for this.  She can follow-up here as needed.

## 2020-04-20 IMAGING — DX PORTABLE CHEST - 1 VIEW
2 series · 2 of 2 positions shown · non-contrast
Comparison: Chest x-ray dated 12/28/2017

CLINICAL DATA: Generalized weakness.

EXAM:
PORTABLE CHEST 1 VIEW

[chest ap (1 of 2)]
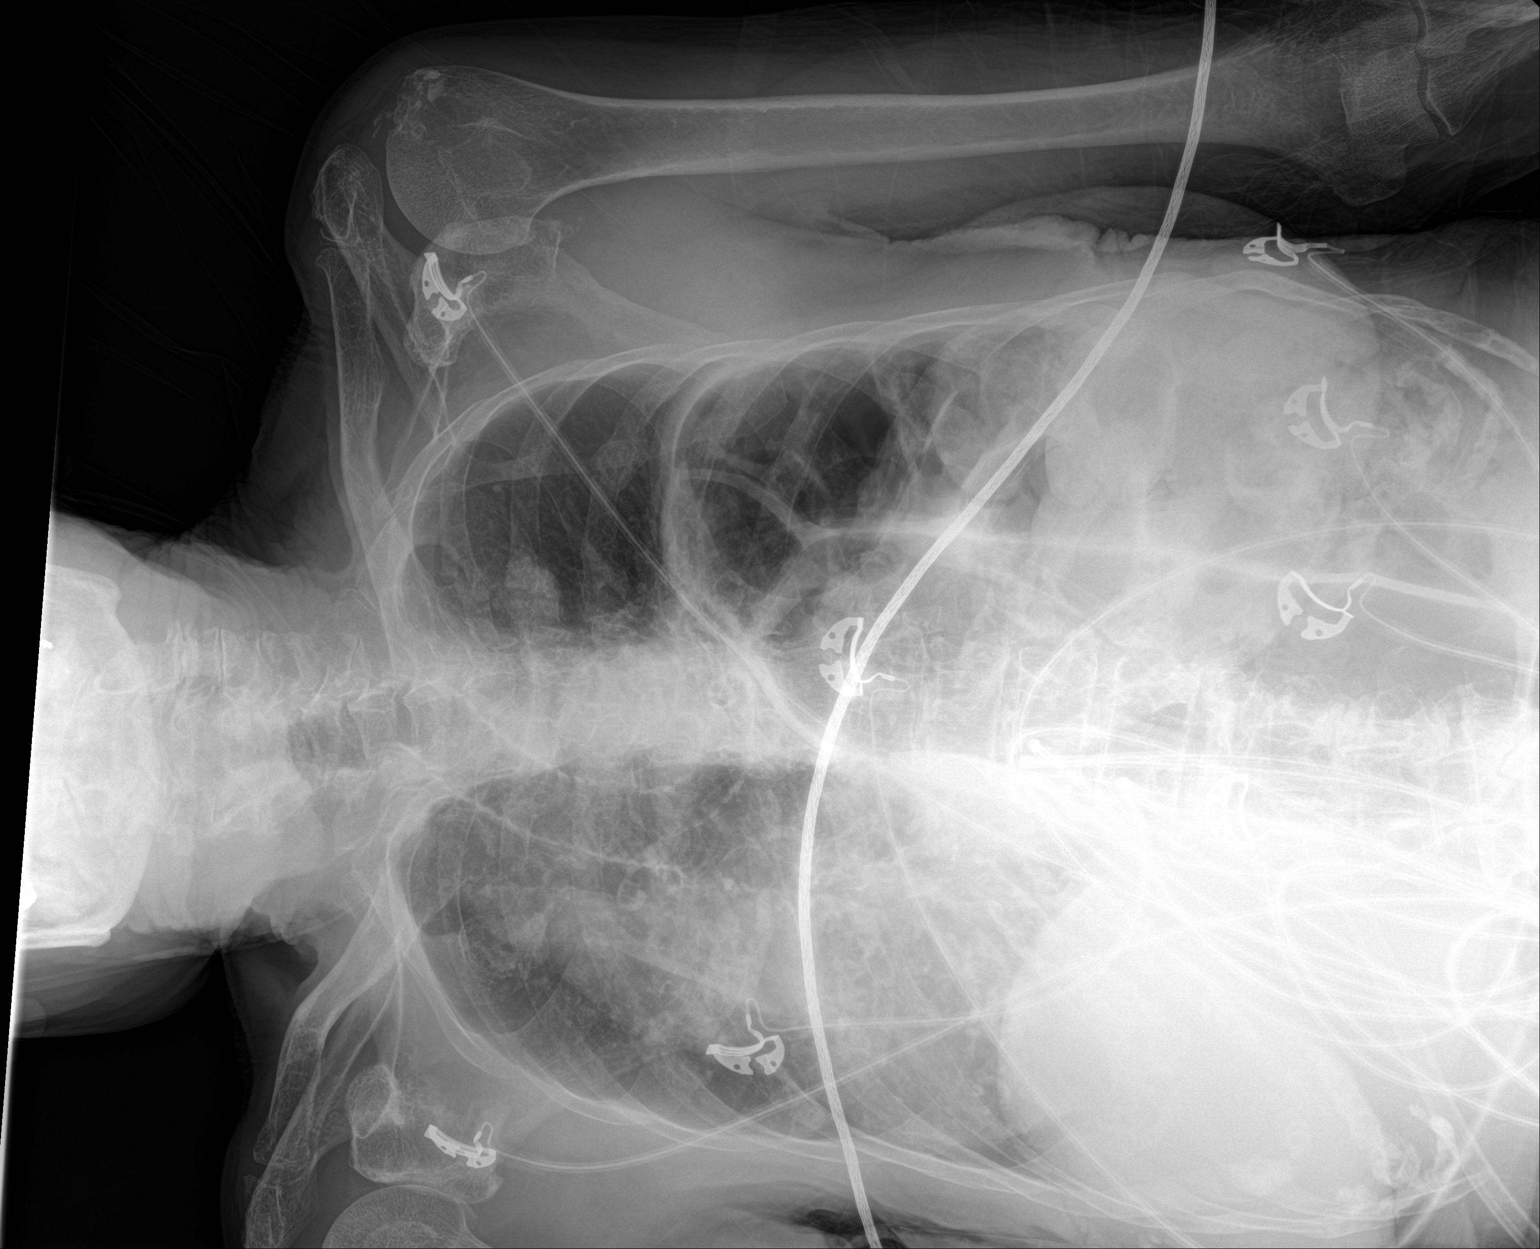

[chest ap (2 of 2)]
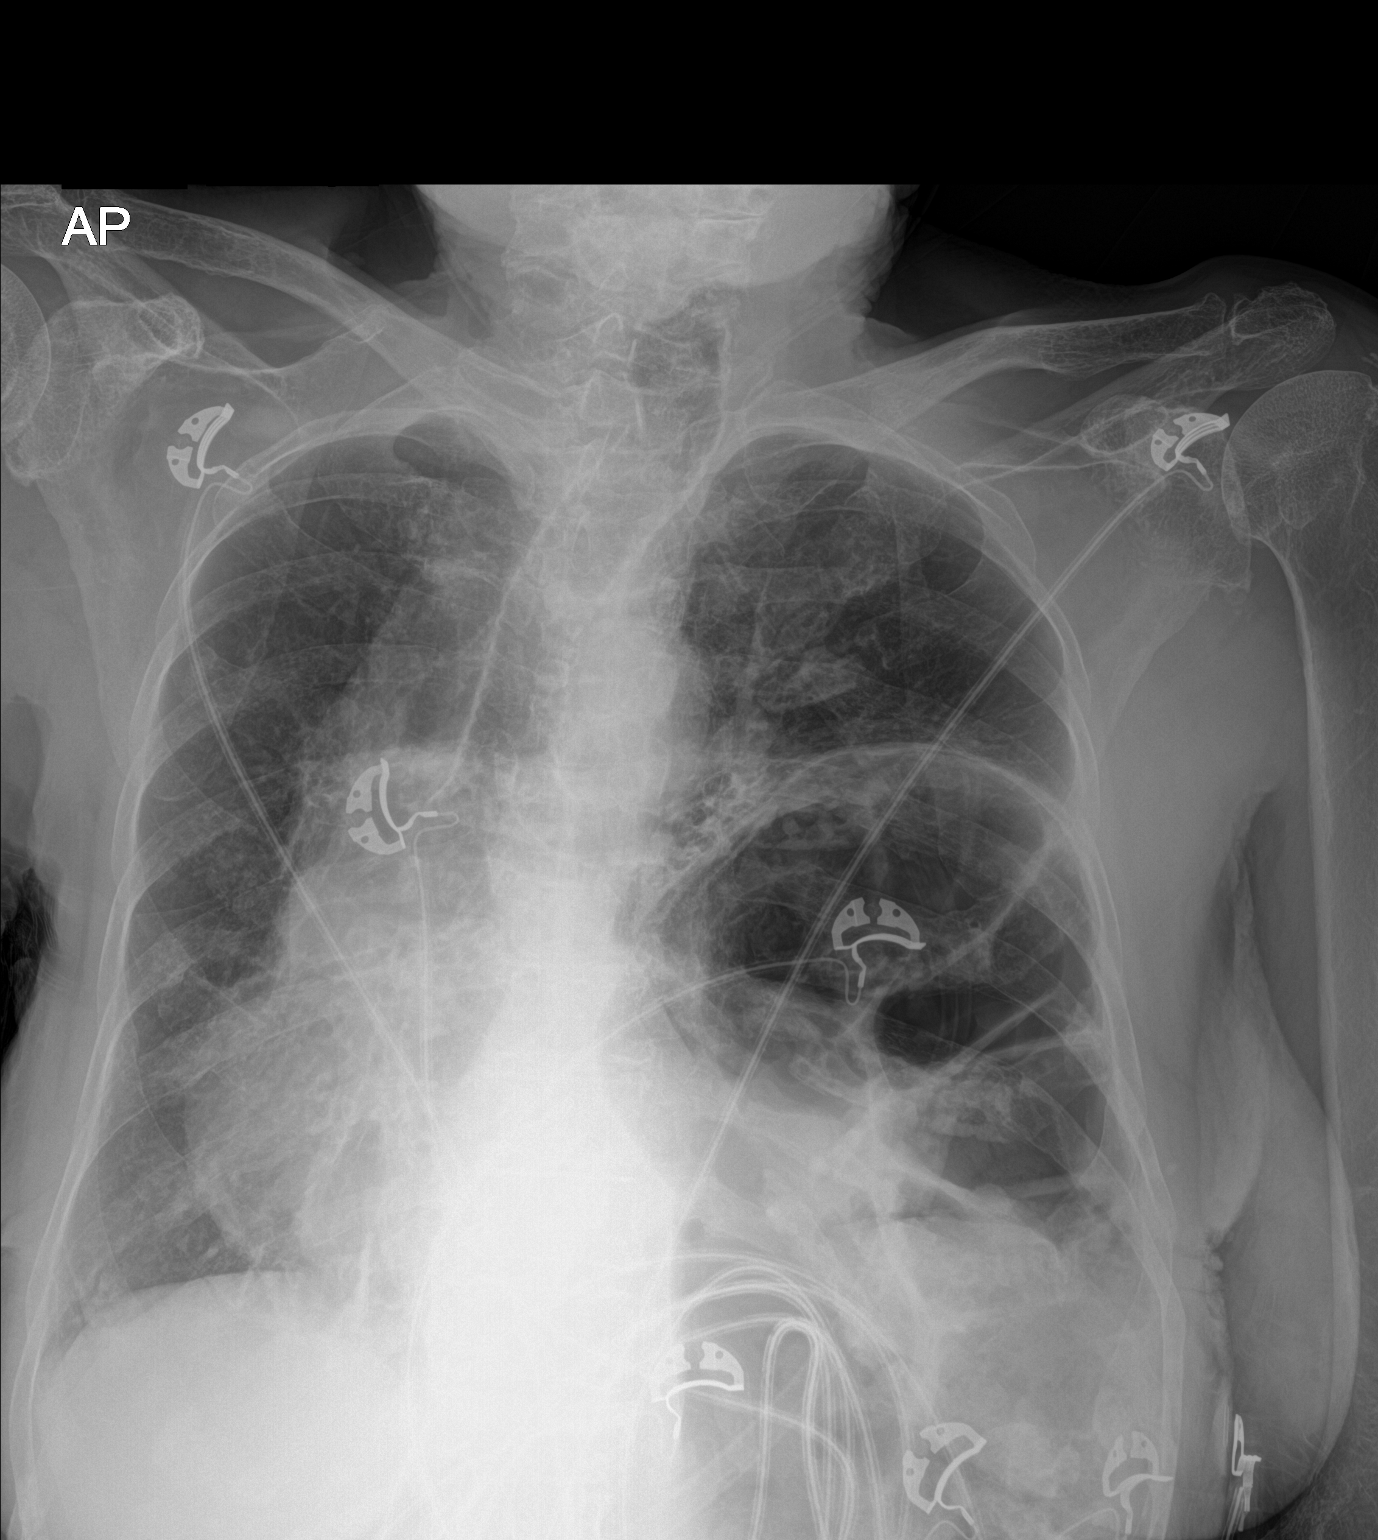

[2 of 2 positions shown; findings below may reference images not displayed]

FINDINGS: The cardiac silhouette is somewhat shifted to the right likely
secondary to the significant elevation of the patient's left
hemidiaphragm. This somewhat limits evaluation of the chest. Chronic
lung changes are again noted. There is no large pleural effusion or
pneumothorax. No acute osseous abnormality detected.
IMPRESSION: No active disease.

## 2020-06-05 IMAGING — CT CT CERVICAL SPINE WITHOUT CONTRAST
3 of 4 series · 13 of 33 positions shown, 16 images · non-contrast
Comparison: Head CT 12/04/2018

CLINICAL DATA: Altered mental status.  Patient found down.

EXAM:
CT HEAD WITHOUT CONTRAST
CT CERVICAL SPINE WITHOUT CONTRAST
TECHNIQUE: Multidetector CT imaging of the head and cervical spine was
performed following the standard protocol without intravenous
contrast. Multiplanar CT image reconstructions of the cervical spine
were also generated.

[Series 6: sag bone · sagittal · 0.33mm/px · 5 of 61 slices shown, 6 images]
[im 21/61  bone]
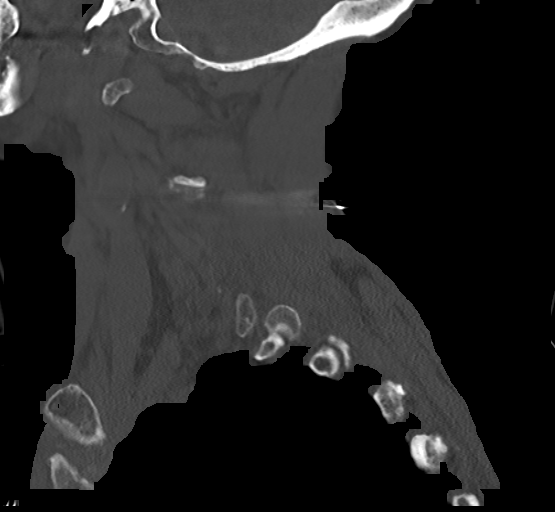
[im 26/61  bone]
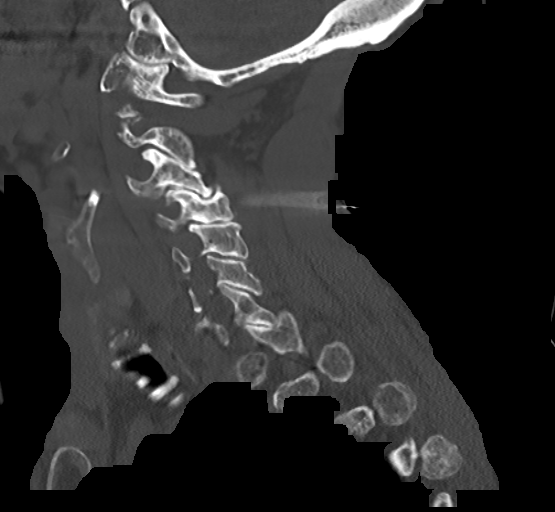
[im 31/61  soft-tissue]
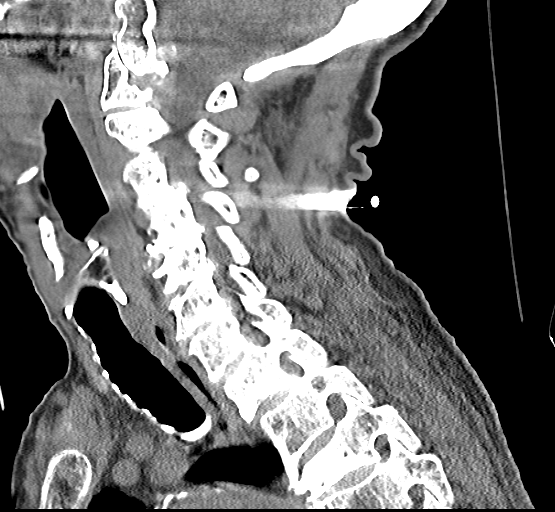
[im 31/61  bone]
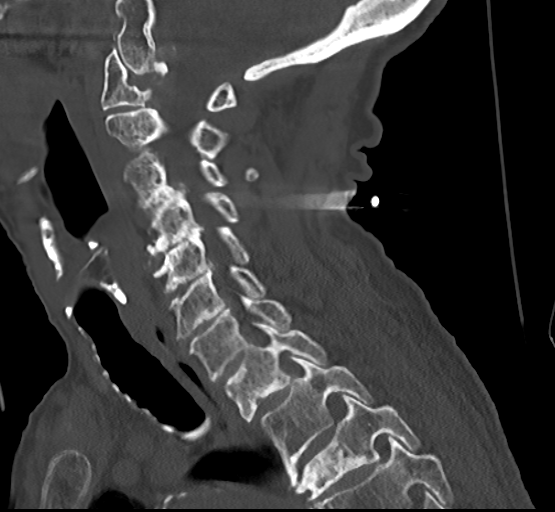
[im 36/61  bone]
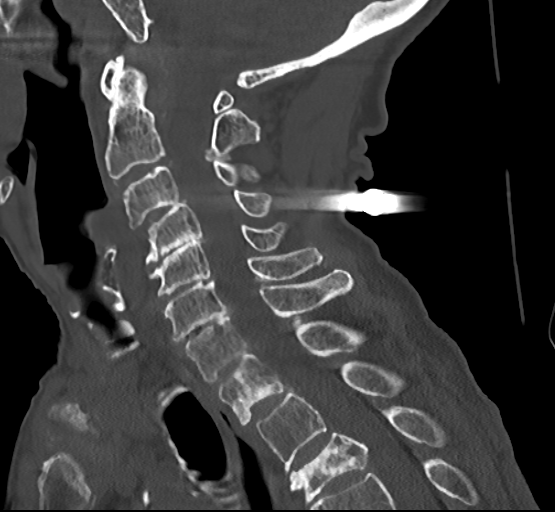
[im 41/61  bone]
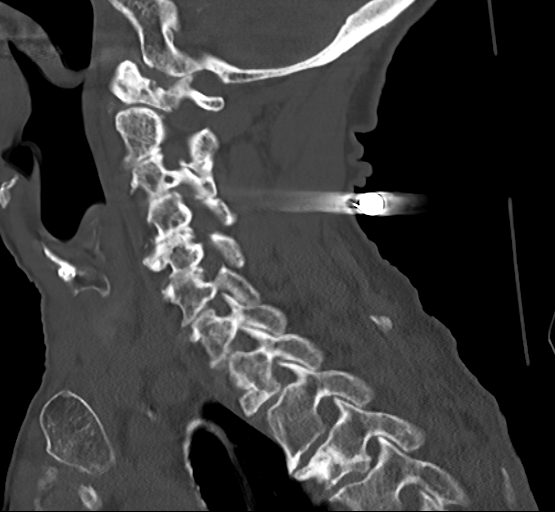

[Series 7: cor bone · coronal · 0.24mm/px · 3 of 85 slices shown]
[im 17/85  bone]
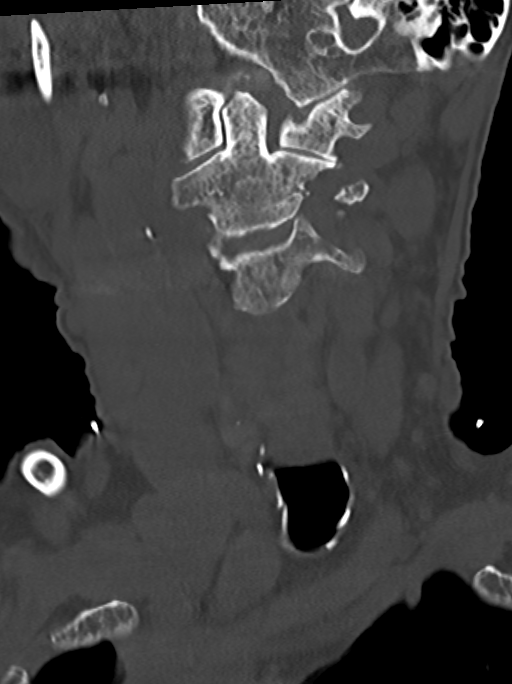
[im 34/85  bone]
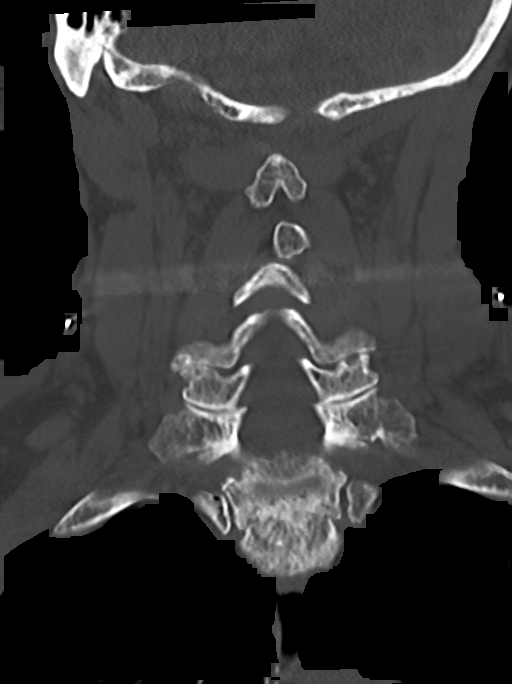
[im 51/85  bone]
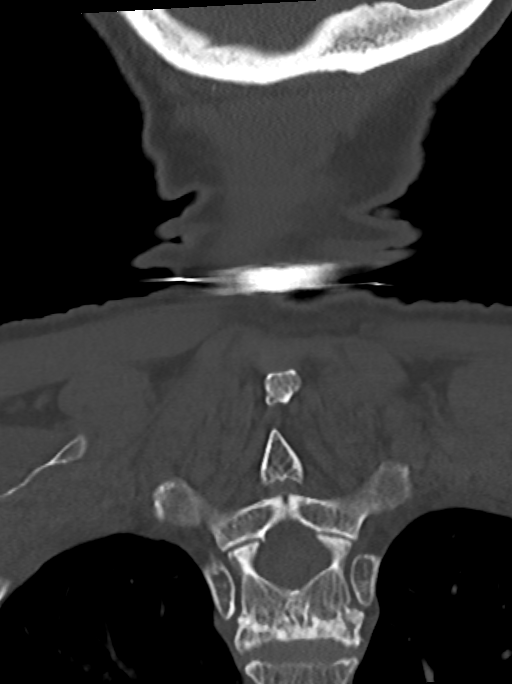

[Series 8: orthogonal axials · oblique · 0.21mm/px · 5 of 82 slices shown, 7 images]
[im 14/82  soft-tissue]
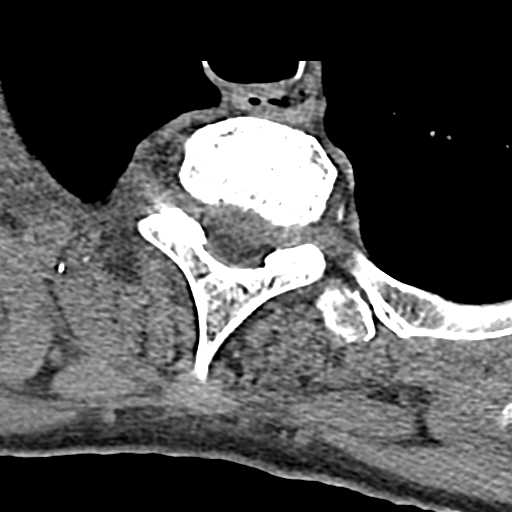
[im 14/82  bone]
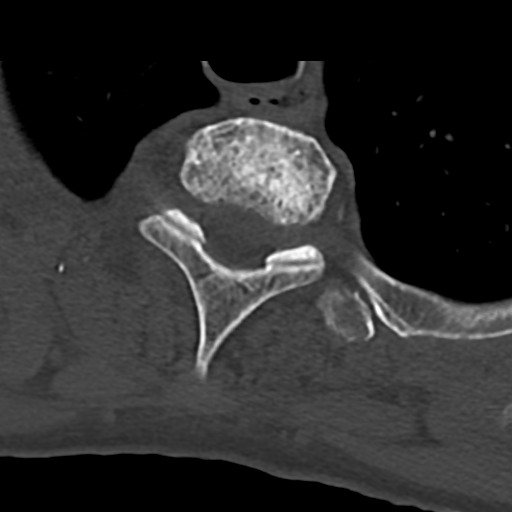
[im 28/82  bone]
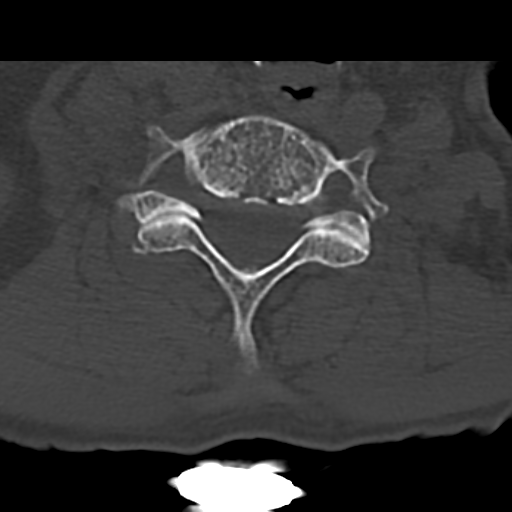
[im 41/82  bone]
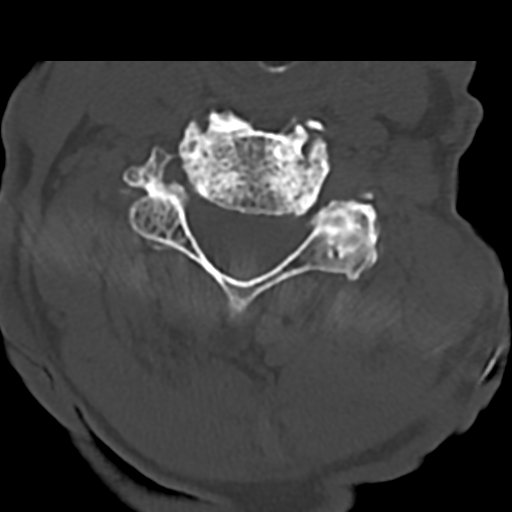
[im 55/82  bone]
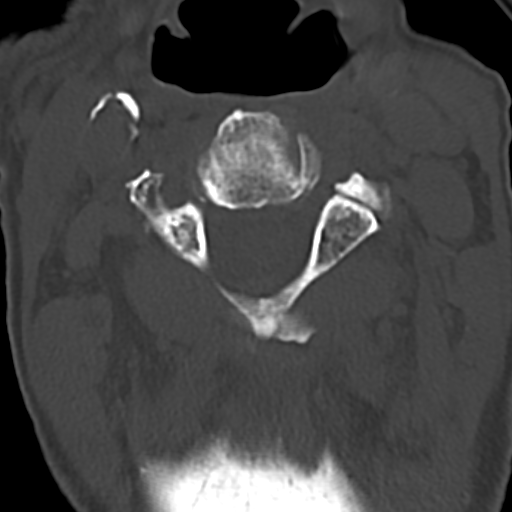
[im 68/82  soft-tissue]
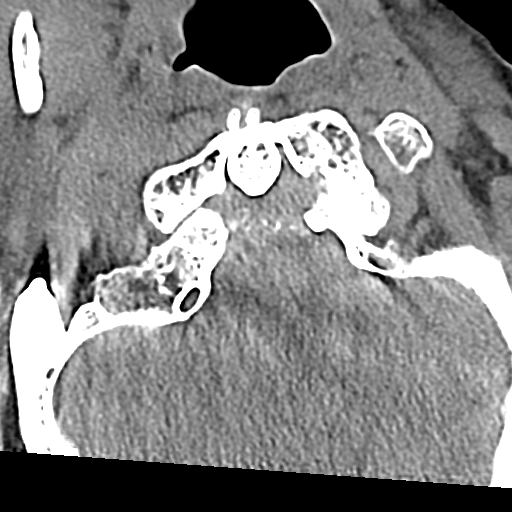
[im 68/82  bone]
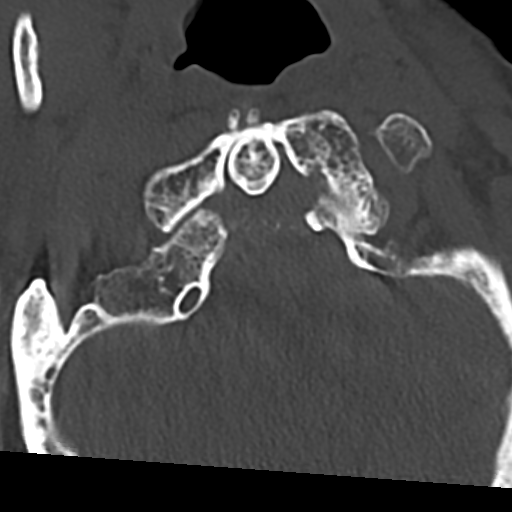

[13 of 33 positions shown; findings below may reference images not displayed]

FINDINGS: CT HEAD FINDINGS

Brain: Stable age related cerebral atrophy, ventriculomegaly and
periventricular white matter disease. No extra-axial fluid
collections are identified. No CT findings for acute hemispheric
infarction or intracranial hemorrhage. No mass lesions. The
brainstem and cerebellum are normal.

Vascular: Stable vascular calcifications. No definite aneurysm or
hyperdense vessels.

Skull: No skull fracture or bone lesions.

Sinuses/Orbits: Right mastoid effusions are noted. The left mastoid
air cells are clear. The paranasal sinuses are clear.

Other: No scalp lesions or scalp hematoma.

CT CERVICAL SPINE FINDINGS

Alignment: Grossly normal overall alignment. There is degenerative
cervical spondylosis with multilevel disc disease and facet disease
and multilevel degenerative subluxations.

Skull base and vertebrae: Advanced degenerative changes at C1-2 with
pannus formation.

There are compression deformities of T1 and T3 with underlying
findings suspicious for mixed lytic and sclerotic bone disease.
Other sclerotic lesions are also noted and findings suspicious for
metastatic disease. Does this patient have known primary?

I do not have any prior studies for comparison but I suspect the
compression fractures are remote. I do not see any obvious
paraspinal tumor or hematoma.

Soft tissues and spinal canal: No abnormal prevertebral soft tissue
swelling. No obvious spinal hematoma.

Disc levels: The spinal canal is fairly generous. No significant
spinal stenosis. There is mild retropulsion of the T3 vertebral body
noted.

Upper chest: The lung apices are grossly clear.

Other: Vascular calcifications but no neck mass or adenopathy.
IMPRESSION: 1. Stable age related cerebral atrophy, ventriculomegaly and
periventricular white matter disease. No acute intracranial findings
or mass lesions.
2. Advanced degenerative cervical spondylosis with multilevel disc
disease and facet disease.
3. Compression deformities of T1 and T3 of uncertain age. Mild
retropulsion at T3.
4. Sclerotic changes suspicious for metastatic disease. Recommend
clinical correlation.

## 2020-07-01 ENCOUNTER — Emergency Department (HOSPITAL_COMMUNITY)
Admission: EM | Admit: 2020-07-01 | Discharge: 2020-07-01 | Disposition: A | Discharge status: Home / Self Care | Payer: Medicare Other | Attending: Emergency Medicine | Admitting: Emergency Medicine

## 2020-07-01 ENCOUNTER — Emergency Department (HOSPITAL_COMMUNITY): Payer: Medicare Other

## 2020-07-01 DIAGNOSIS — R2243 Localized swelling, mass and lump, lower limb, bilateral: Secondary | ICD-10-CM | POA: Diagnosis not present

## 2020-07-01 DIAGNOSIS — E876 Hypokalemia: Secondary | ICD-10-CM | POA: Diagnosis not present

## 2020-07-01 DIAGNOSIS — S0083XA Contusion of other part of head, initial encounter: Secondary | ICD-10-CM

## 2020-07-01 DIAGNOSIS — W19XXXA Unspecified fall, initial encounter: Secondary | ICD-10-CM | POA: Diagnosis not present

## 2020-07-01 DIAGNOSIS — S0990XA Unspecified injury of head, initial encounter: Secondary | ICD-10-CM | POA: Diagnosis present

## 2020-07-01 LAB — BASIC METABOLIC PANEL
Anion gap: 7 (ref 5–15)
BUN: 19 mg/dL (ref 8–23)
CO2: 41 mmol/L — ABNORMAL HIGH (ref 22–32)
Calcium: 8.9 mg/dL (ref 8.9–10.3)
Chloride: 91 mmol/L — ABNORMAL LOW (ref 98–111)
Creatinine, Ser: 0.75 mg/dL (ref 0.44–1.00)
GFR, Estimated: >60 mL/min (ref >60)
Glucose, Bld: 92 mg/dL (ref 70–99)
Potassium: 3.3 mmol/L — ABNORMAL LOW (ref 3.5–5.1)
Sodium: 139 mmol/L (ref 135–145)

## 2020-07-01 LAB — CBC WITH DIFFERENTIAL/PLATELET
Abs Immature Granulocytes: 0.01 10*3/uL (ref 0.00–0.07)
Basophils Absolute: 0 10*3/uL (ref 0.0–0.1)
Basophils Relative: 0 %
Eosinophils Absolute: 0.2 10*3/uL (ref 0.0–0.5)
Eosinophils Relative: 3 %
HCT: 31.1 % — ABNORMAL LOW (ref 36.0–46.0)
Hemoglobin: 9.3 g/dL — ABNORMAL LOW (ref 12.0–15.0)
Immature Granulocytes: 0 %
Lymphocytes Relative: 27 %
Lymphs Abs: 1.3 10*3/uL (ref 0.7–4.0)
MCH: 27.6 pg (ref 26.0–34.0)
MCHC: 29.9 g/dL — ABNORMAL LOW (ref 30.0–36.0)
MCV: 92.3 fL (ref 80.0–100.0)
Monocytes Absolute: 0.5 10*3/uL (ref 0.1–1.0)
Monocytes Relative: 10 %
Neutro Abs: 2.9 10*3/uL (ref 1.7–7.7)
Neutrophils Relative %: 60 %
Platelets: 205 10*3/uL (ref 150–400)
RBC: 3.37 MIL/uL — ABNORMAL LOW (ref 3.87–5.11)
RDW: 14.2 % (ref 11.5–15.5)
WBC: 5 10*3/uL (ref 4.0–10.5)
nRBC: 0 % (ref 0.0–0.2)

## 2020-07-01 MED ORDER — SODIUM CHLORIDE 0.9 % IV BOLUS
500.0000 mL | Freq: Once | INTRAVENOUS | Status: AC
  Administered 2020-07-01: 500 mL via INTRAVENOUS

## 2020-07-01 MED ORDER — POTASSIUM CHLORIDE CRYS ER 20 MEQ PO TBCR
20.0000 meq | EXTENDED_RELEASE_TABLET | Freq: Once | ORAL | Status: AC
  Administered 2020-07-01: 20 meq via ORAL
  Filled 2020-07-01: qty 1

## 2020-07-01 NOTE — ED Provider Notes (Signed)
[facility] EMERGENCY DEPARTMENT Provider Note   CSN: [id number] Arrival date & time: 07/01/20  9563     History Chief Complaint  Patient presents with  . Fall    no thinners    Beverly Adkins is a 86 y.o. female.  5 caveat secondary to dementia.  Patient is a resident of [facility].  Reportedly fell out of her wheelchair striking her head yesterday.  History of falls.  Was evaluated then and cleared.  This morning staff noticed swelling to her forehead and called EMS.  Patient denies any complaints.  She recalls the fall although cannot tell me why she fell.  No chest pain or shortness of breath no abdominal pain.  No neck or back pain.  She says at baseline she does not walk much and stays mostly in the wheelchair.  Not on blood thinners  The history is provided by the patient.  Fall This is a recurrent problem. The current episode started yesterday. The problem has not changed since onset.Pertinent negatives include no chest pain, no abdominal pain, no headaches and no shortness of breath. Nothing aggravates the symptoms. Nothing relieves the symptoms. She has tried nothing for the symptoms. The treatment provided no relief.       Past Medical History:  Diagnosis Date  . Abrasion of cornea, left   . Anal ulcer   . Anxiety   . Aphthous ulcer   . Carotid artery stenosis   . COPD (chronic obstructive pulmonary disease) ([place])   . Elevated hemidiaphragm    Left  . Fracture of femur, shaft ([place])    closed, Right  . GERD (gastroesophageal reflux disease)   . Hyperkalemia   . Incontinence of urine    urge  . Osteoarthritis   . Osteoporosis   . Peripheral neuropathy     Patient Active Problem List   Diagnosis Date Noted  . Recurrent falls 02/23/2019  . 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 COPD (chronic obstructive pulmonary disease) ([place]) 02/22/2019  . DJD (degenerative joint disease) 02/22/2019  . Near syncope 01/19/2019  . Dementia ([place])  01/19/2019  . Constipation 10/18/2011  . Fracture lumbar vertebra-closed ([place]) 10/18/2011  . 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 Anxiety   . Aphthous ulcer   . Carotid artery stenosis   . GERD (gastroesophageal reflux disease)   . Osteoporosis   . Peripheral neuropathy   . Incontinence of urine     Past Surgical History:  Procedure Laterality Date  . CATARACT EXTRACTION    . CESAREAN SECTION    . FOOT SURGERY    . HIP SURGERY     right, Dr. Ninfa Linden  . neuroplasty decompression median nerve at carpal tunnel       OB History   No obstetric history on file.     Family History  Problem Relation Age of Onset  . Alzheimer's disease Other   . Diabetes type II Other   . Hypertension Other   . Mental retardation Other   . Osteoporosis Other   . Parkinsonism Other   . Cancer Other   . Heart disease Other     Social History   Tobacco Use  . Smoking status: Never Smoker  . Smokeless tobacco: Never Used  Substance Use Topics  . Alcohol use: No  . Drug use: No    Home Medications Prior to Admission medications   Medication Sig Start Date End Date Taking? Authorizing Provider  acidophilus (RISAQUAD) CAPS capsule Take 1  capsule by mouth daily.     [provider]  carvedilol (COREG) 3.125 MG tablet Take 1 tablet (3.125 mg total) by mouth 2 (two) times daily with a meal. 01/24/19   Antonieta Pert, MD  chlorhexidine (PERIDEX) 0.12 % solution 2 (two) times daily.  04/04/19   [provider]  furosemide (LASIX) 40 MG tablet 40 mg 2 (two) times daily.  04/17/19   [provider]  Infant Care Products (BABY [facility] EX) Apply topically. Once daily to wash  eyelids    [provider]  ipratropium-albuterol (DUONEB) 0.5-2.5 (3) MG/3ML SOLN Take 3 mLs by nebulization every 6 (six) hours as needed. 01/24/19   Antonieta Pert, MD  loratadine (CLARITIN) 10 MG tablet Take 10 mg by mouth daily. Patient not taking: Reported on 04/01/2020    [provider]  olopatadine (PATANOL) 0.1 % ophthalmic solution 1 drop daily.    [provider]  omeprazole (PRILOSEC) 20 MG capsule Take 20 mg by mouth daily. 01/17/20   [provider]  OXYGEN Inhale 3 L into the lungs.    [provider]  senna-docusate (SENOKOT-S) 8.6-50 MG tablet Take 1 tablet by mouth daily as needed for mild constipation. 01/24/19   Antonieta Pert, MD  Skin Protectants, Misc. (CALAZIME SKIN PROTECTANT EX) Apply topically in the morning, at noon, and at bedtime.    [provider]  sorbitol 70 % SOLN Take 20 mLs by mouth daily as needed (CONSTIPATION). Patient not taking: Reported on 01/22/2020 10/27/11   Mendel [facility], MD  [facility] 160-4.5 MCG/ACT inhaler  04/04/19   [provider]  tamsulosin (FLOMAX) 0.4 MG CAPS capsule Take 1 capsule (0.4 mg total) by mouth daily after supper. 01/24/19   Antonieta Pert, MD  zinc gluconate 50 MG tablet Take 50 mg by mouth daily. Patient not taking: Reported on 01/22/2020    [provider]  Zinc Oxide 25 % PSTE Apply topically. Patient not taking: Reported on 03/03/2020    [provider]    Allergies    Tape  Review of Systems   Review of Systems  Constitutional: Negative for fever.  HENT: Negative for sore throat.   Eyes: Negative for visual disturbance.  Respiratory: Negative for shortness of breath.   Cardiovascular: Negative for chest pain.  Gastrointestinal: Negative for abdominal pain.  Genitourinary: Negative for dysuria.  Musculoskeletal: Negative for neck pain.  Skin: Negative for rash.  Neurological: Negative for headaches.    Physical Exam Updated Vital Signs BP (!) 92/51 (BP Location: Right Arm)   Pulse 78   Temp 98 F (36.7 C) (Oral)   Resp 12   SpO2 99%   Physical Exam Vitals and nursing note reviewed.  Constitutional:      General: She is not in acute distress.    Appearance: Normal appearance. She is well-developed.  HENT:     Head:  Normocephalic.     Comments: She has some bruising across to her forehead.  Dependently settling around her eyes. Eyes:     Conjunctiva/sclera: Conjunctivae normal.  Cardiovascular:     Rate and Rhythm: Normal rate and regular rhythm.     Heart sounds: No murmur heard.   Pulmonary:     Effort: Pulmonary effort is normal. No respiratory distress.     Breath sounds: Normal breath sounds.  Abdominal:     Palpations: Abdomen is soft.     Tenderness: There is no abdominal tenderness.  Musculoskeletal:  General: No deformity or signs of injury.     Cervical back: Neck supple.     Right lower leg: Edema present.     Left lower leg: Edema present.     Comments: She has chronic edema of her lower extremities and they are in wraps.  Skin:    General: Skin is warm and dry.  Neurological:     General: No focal deficit present.     Mental Status: She is alert. Mental status is at baseline.     ED Results / Procedures / Treatments   Labs (all labs ordered are listed, but only abnormal results are displayed) Labs Reviewed  BASIC METABOLIC PANEL - Abnormal; Notable for the following components:      Result Value   Potassium 3.3 (*)    Chloride 91 (*)    CO2 41 (*)    All other components within normal limits  CBC WITH DIFFERENTIAL/PLATELET - Abnormal; Notable for the following components:   RBC 3.37 (*)    Hemoglobin 9.3 (*)    HCT 31.1 (*)    MCHC 29.9 (*)    All other components within normal limits    EKG None  Radiology CT Head Wo Contrast  Result Date: 07/01/2020 CLINICAL DATA:  Pain following fall EXAM: CT HEAD WITHOUT CONTRAST CT CERVICAL SPINE WITHOUT CONTRAST TECHNIQUE: Multidetector CT imaging of the head and cervical spine was performed following the standard protocol without intravenous contrast. Multiplanar CT image reconstructions of the cervical spine were also generated. COMPARISON:  CT head and CT cervical spine August 20, 2019 FINDINGS: CT HEAD FINDINGS  Brain: Mild diffuse atrophy is stable. There is no intracranial mass, hemorrhage, extra-axial fluid collection, or midline shift. Patchy small vessel disease in the centra semiovale bilaterally is stable. No acute infarct appreciable. Vascular: No hyperdense vessel. There is calcification in each carotid siphon region. Skull: The bony calvarium appears intact. There is a midline frontal scalp hematoma extending slightly to the left. Sinuses/Orbits: There is mucosal thickening in several ethmoid air cells. Other visualized paranasal sinuses are clear. Orbits appear symmetric bilaterally. Other: Mastoid air cells on the left are clear. There is opacification of several mastoid air cells on the right inferiorly. CT CERVICAL SPINE FINDINGS Alignment: There remains 2 mm of anterolisthesis of C3 on C4. There is stable 1 mm of retrolisthesis of C5 on C6. No new spondylolisthesis evident. Skull base and vertebrae: Skull base and craniocervical junction regions appear stable. Moderate pannus posterior to the odontoid is stable without appreciable impression on the craniocervical junction. Bones are osteoporotic. There is chronic anterior wedging at C5 and T1. No acute fracture is appreciable. No blastic or lytic bone lesions are evident. Soft tissues and spinal canal: Prevertebral soft tissues and predental space regions are normal. No evident cord or canal hematoma. No paraspinous lesions evident. Disc levels: There is moderately severe disc space narrowing at C4-5, C5-6, and C6-7. Milder disc space narrowing is noted at C7-T1. There is facet hypertrophy to varying degrees at all levels. There is exit foraminal narrowing due to bony hypertrophy at C2-3 on the left, C3-4 bilaterally, and C4-5 on the left, with impression on the exiting nerve root at C4-5 on the left. There is no frank disc extrusion or stenosis. Upper chest: Visualized upper lung regions are clear. No apical pneumothorax. Other: Calcification is noted in  each carotid artery. IMPRESSION: CT head: Stable atrophy with periventricular small vessel disease. No acute infarct. No mass or hemorrhage. Frontal scalp hematoma  without underlying bony abnormality. Stable inferior right mastoid air cell disease. Mild mucosal thickening in several ethmoid air cells noted. Foci of arterial vascular calcification noted. CT cervical spine: 1. Diffuse osteoporosis. Mild anterior wedging at C5 and T1, stable. No new fracture. Slight spondylolisthesis at C3-4 and C5-6 is stable and likely due to underlying spondylosis. No new spondylolisthesis. 2. Multilevel arthropathy with exit foraminal narrowing at several levels. No frank disc extrusion or stenosis. 3.  Foci of carotid artery calcification noted bilaterally. Electronically Signed   By: Lowella Grip III M.D.   On: 07/01/2020 08:38   CT Cervical Spine Wo Contrast  Result Date: 07/01/2020 CLINICAL DATA:  Pain following fall EXAM: CT HEAD WITHOUT CONTRAST CT CERVICAL SPINE WITHOUT CONTRAST TECHNIQUE: Multidetector CT imaging of the head and cervical spine was performed following the standard protocol without intravenous contrast. Multiplanar CT image reconstructions of the cervical spine were also generated. COMPARISON:  CT head and CT cervical spine August 20, 2019 FINDINGS: CT HEAD FINDINGS Brain: Mild diffuse atrophy is stable. There is no intracranial mass, hemorrhage, extra-axial fluid collection, or midline shift. Patchy small vessel disease in the centra semiovale bilaterally is stable. No acute infarct appreciable. Vascular: No hyperdense vessel. There is calcification in each carotid siphon region. Skull: The bony calvarium appears intact. There is a midline frontal scalp hematoma extending slightly to the left. Sinuses/Orbits: There is mucosal thickening in several ethmoid air cells. Other visualized paranasal sinuses are clear. Orbits appear symmetric bilaterally. Other: Mastoid air cells on the left are clear.  There is opacification of several mastoid air cells on the right inferiorly. CT CERVICAL SPINE FINDINGS Alignment: There remains 2 mm of anterolisthesis of C3 on C4. There is stable 1 mm of retrolisthesis of C5 on C6. No new spondylolisthesis evident. Skull base and vertebrae: Skull base and craniocervical junction regions appear stable. Moderate pannus posterior to the odontoid is stable without appreciable impression on the craniocervical junction. Bones are osteoporotic. There is chronic anterior wedging at C5 and T1. No acute fracture is appreciable. No blastic or lytic bone lesions are evident. Soft tissues and spinal canal: Prevertebral soft tissues and predental space regions are normal. No evident cord or canal hematoma. No paraspinous lesions evident. Disc levels: There is moderately severe disc space narrowing at C4-5, C5-6, and C6-7. Milder disc space narrowing is noted at C7-T1. There is facet hypertrophy to varying degrees at all levels. There is exit foraminal narrowing due to bony hypertrophy at C2-3 on the left, C3-4 bilaterally, and C4-5 on the left, with impression on the exiting nerve root at C4-5 on the left. There is no frank disc extrusion or stenosis. Upper chest: Visualized upper lung regions are clear. No apical pneumothorax. Other: Calcification is noted in each carotid artery. IMPRESSION: CT head: Stable atrophy with periventricular small vessel disease. No acute infarct. No mass or hemorrhage. Frontal scalp hematoma without underlying bony abnormality. Stable inferior right mastoid air cell disease. Mild mucosal thickening in several ethmoid air cells noted. Foci of arterial vascular calcification noted. CT cervical spine: 1. Diffuse osteoporosis. Mild anterior wedging at C5 and T1, stable. No new fracture. Slight spondylolisthesis at C3-4 and C5-6 is stable and likely due to underlying spondylosis. No new spondylolisthesis. 2. Multilevel arthropathy with exit foraminal narrowing at  several levels. No frank disc extrusion or stenosis. 3.  Foci of carotid artery calcification noted bilaterally. Electronically Signed   By: Lowella Grip III M.D.   On: 07/01/2020 08:38    Procedures  Procedures (including critical care time)  Medications Ordered in ED Medications  sodium chloride 0.9 % bolus 500 mL (0 mLs Intravenous Stopped 07/01/20 0854)  potassium chloride SA (KLOR-CON) CR tablet 20 mEq (20 mEq Oral Given 07/01/20 0902)    ED Course  I have reviewed the triage vital signs and the nursing notes.  Pertinent labs & imaging results that were available during my care of the patient were reviewed by me and considered in my medical decision making (see chart for details).  Clinical Course as of Jul 01 1757  Tue Jul 01, 2020  0909 Patient ambulated to the bathroom without any difficulty.  Her imaging did not show any acute findings and I removed her cervical collar.   [MB]    Clinical Course User Index [MB] Hayden Rasmussen, MD   MDM Rules/Calculators/A&P                         This patient complains of fall with head injury; this involves an extensive number of treatment Options and is a complaint that carries with it a high risk of complications and Morbidity. The differential includes contusion, hematoma, skull fracture, intracranial bleed, stroke, cervical fracture, derangement, anemia  I ordered, reviewed and interpreted labs, which included CBC with normal white count, hemoglobin slightly lower than baseline, chemistries fairly normal other than low potassium supplemented I ordered medication IV fluids oral potassium I ordered imaging studies which included CT head and cervical spine and I independently    visualized and interpreted imaging which showed degenerative changes no acute findings Previous records obtained and reviewed in epic, no recent admissions  After the interventions stated above, I reevaluated the patient and found patient to be resting  comfortably.  Reviewed her results with her and she is looking forward to returning to her facility.  Return instructions discussed.   Final Clinical Impression(s) / ED Diagnoses Final diagnoses:  Fall, initial encounter  Contusion of face, initial encounter  Hypokalemia    Rx / DC Orders ED Discharge Orders    None       Hayden Rasmussen, MD 07/01/20 1800

## 2020-07-01 NOTE — ED Triage Notes (Signed)
BIB [facility] after [facility] staff called to report that pt had fallen and sustained an increase swelling to forehead. Per EMS, pt had fallen 11/29 in the evening, was seen by NP and was cleared. Upon awakening this AM, staff noticed increase swelling and EMS was called. PT is oriented to self. Pt has hx of dementia, HTN. Pt on 3 L [place] baseline.

## 2020-07-01 NOTE — ED Notes (Signed)
Updated pt's dtr, Florentina Jenny, and let her know pt is returning to [facility].

## 2020-07-01 NOTE — Discharge Instructions (Addendum)
You were seen in the emergency department for evaluation of injuries from a fall.  You had a CAT scan of your head and cervical spine that did not show any acute findings other than the forehead bruising.  Your lab work showed you to be mildly anemic and your potassium was slightly low.  Please follow-up with your regular doctor.  Return to the emergency department for any worsening or concerning symptoms.

## 2020-07-01 NOTE — ED Notes (Signed)
PTAR at bedside for discharge

## 2020-07-01 NOTE — ED Notes (Signed)
Called report to [facility]- pt will be returning via [place]

## 2020-09-02 DEATH — deceased
